# Patient Record
Sex: Male | Born: 1970 | Race: White | Hispanic: No | Smoking: Current every day smoker
Health system: Southern US, Community
[De-identification: ages and names within clinical notes are randomized; demographics above are authoritative.]

## PROBLEM LIST (undated history)

## (undated) DIAGNOSIS — Q6689 Other  specified congenital deformities of feet: Secondary | ICD-10-CM

## (undated) DIAGNOSIS — I10 Essential (primary) hypertension: Secondary | ICD-10-CM

## (undated) DIAGNOSIS — M1990 Unspecified osteoarthritis, unspecified site: Secondary | ICD-10-CM

---

## 2000-03-29 ENCOUNTER — Emergency Department (HOSPITAL_COMMUNITY): Admission: EM | Admit: 2000-03-29 | Discharge: 2000-03-30 | Payer: Self-pay | Admitting: Emergency Medicine

## 2000-07-11 ENCOUNTER — Emergency Department (HOSPITAL_COMMUNITY): Admission: EM | Admit: 2000-07-11 | Discharge: 2000-07-11 | Payer: Self-pay | Admitting: Emergency Medicine

## 2000-07-17 ENCOUNTER — Emergency Department (HOSPITAL_COMMUNITY): Admission: EM | Admit: 2000-07-17 | Discharge: 2000-07-17 | Payer: Self-pay

## 2000-12-07 ENCOUNTER — Emergency Department (HOSPITAL_COMMUNITY): Admission: EM | Admit: 2000-12-07 | Discharge: 2000-12-07 | Payer: Self-pay | Admitting: Emergency Medicine

## 2001-01-02 ENCOUNTER — Encounter: Admission: RE | Admit: 2001-01-02 | Discharge: 2001-01-02 | Payer: Self-pay | Admitting: Family Medicine

## 2001-07-16 ENCOUNTER — Emergency Department (HOSPITAL_COMMUNITY): Admission: EM | Admit: 2001-07-16 | Discharge: 2001-07-16 | Payer: Self-pay

## 2001-07-18 ENCOUNTER — Encounter: Admission: RE | Admit: 2001-07-18 | Discharge: 2001-07-18 | Payer: Self-pay | Admitting: Family Medicine

## 2001-07-24 ENCOUNTER — Inpatient Hospital Stay (HOSPITAL_COMMUNITY): Admission: EM | Admit: 2001-07-24 | Discharge: 2001-08-01 | Payer: Self-pay | Admitting: *Deleted

## 2001-08-06 ENCOUNTER — Emergency Department (HOSPITAL_COMMUNITY): Admission: EM | Admit: 2001-08-06 | Discharge: 2001-08-06 | Payer: Self-pay | Admitting: Emergency Medicine

## 2001-08-19 ENCOUNTER — Emergency Department (HOSPITAL_COMMUNITY): Admission: EM | Admit: 2001-08-19 | Discharge: 2001-08-19 | Payer: Self-pay | Admitting: Emergency Medicine

## 2001-09-17 ENCOUNTER — Emergency Department (HOSPITAL_COMMUNITY): Admission: EM | Admit: 2001-09-17 | Discharge: 2001-09-17 | Payer: Self-pay

## 2001-11-02 ENCOUNTER — Encounter: Admission: RE | Admit: 2001-11-02 | Discharge: 2001-11-02 | Payer: Self-pay | Admitting: Family Medicine

## 2001-12-04 ENCOUNTER — Emergency Department (HOSPITAL_COMMUNITY): Admission: EM | Admit: 2001-12-04 | Discharge: 2001-12-04 | Payer: Self-pay

## 2002-07-23 ENCOUNTER — Emergency Department (HOSPITAL_COMMUNITY): Admission: EM | Admit: 2002-07-23 | Discharge: 2002-07-23 | Payer: Self-pay | Admitting: Emergency Medicine

## 2002-07-23 ENCOUNTER — Encounter: Payer: Self-pay | Admitting: Emergency Medicine

## 2002-07-28 ENCOUNTER — Encounter: Payer: Self-pay | Admitting: Emergency Medicine

## 2002-07-28 ENCOUNTER — Emergency Department (HOSPITAL_COMMUNITY): Admission: EM | Admit: 2002-07-28 | Discharge: 2002-07-28 | Payer: Self-pay | Admitting: Emergency Medicine

## 2002-07-29 ENCOUNTER — Emergency Department (HOSPITAL_COMMUNITY): Admission: EM | Admit: 2002-07-29 | Discharge: 2002-07-29 | Payer: Self-pay | Admitting: *Deleted

## 2002-08-02 ENCOUNTER — Inpatient Hospital Stay (HOSPITAL_COMMUNITY): Admission: EM | Admit: 2002-08-02 | Discharge: 2002-08-03 | Payer: Self-pay | Admitting: Emergency Medicine

## 2002-08-03 ENCOUNTER — Inpatient Hospital Stay (HOSPITAL_COMMUNITY): Admission: EM | Admit: 2002-08-03 | Discharge: 2002-08-07 | Payer: Self-pay | Admitting: Psychiatry

## 2002-08-16 ENCOUNTER — Ambulatory Visit (HOSPITAL_COMMUNITY): Admission: EM | Admit: 2002-08-16 | Discharge: 2002-08-17 | Payer: Self-pay | Admitting: Emergency Medicine

## 2002-08-16 ENCOUNTER — Encounter: Payer: Self-pay | Admitting: Emergency Medicine

## 2002-08-22 ENCOUNTER — Encounter: Payer: Self-pay | Admitting: Emergency Medicine

## 2002-08-22 ENCOUNTER — Emergency Department (HOSPITAL_COMMUNITY): Admission: EM | Admit: 2002-08-22 | Discharge: 2002-08-22 | Payer: Self-pay | Admitting: Emergency Medicine

## 2002-08-22 ENCOUNTER — Encounter: Admission: RE | Admit: 2002-08-22 | Discharge: 2002-08-22 | Payer: Self-pay | Admitting: Family Medicine

## 2002-11-07 ENCOUNTER — Encounter: Payer: Self-pay | Admitting: Emergency Medicine

## 2002-11-07 ENCOUNTER — Emergency Department (HOSPITAL_COMMUNITY): Admission: EM | Admit: 2002-11-07 | Discharge: 2002-11-07 | Payer: Self-pay | Admitting: Emergency Medicine

## 2003-03-20 ENCOUNTER — Inpatient Hospital Stay (HOSPITAL_COMMUNITY): Admission: EM | Admit: 2003-03-20 | Discharge: 2003-03-21 | Payer: Self-pay | Admitting: *Deleted

## 2003-04-30 ENCOUNTER — Encounter: Admission: RE | Admit: 2003-04-30 | Discharge: 2003-04-30 | Payer: Self-pay | Admitting: Family Medicine

## 2003-08-09 ENCOUNTER — Encounter: Admission: RE | Admit: 2003-08-09 | Discharge: 2003-08-09 | Payer: Self-pay | Admitting: Family Medicine

## 2003-09-12 ENCOUNTER — Encounter: Admission: RE | Admit: 2003-09-12 | Discharge: 2003-09-12 | Payer: Self-pay | Admitting: Family Medicine

## 2003-09-23 ENCOUNTER — Inpatient Hospital Stay (HOSPITAL_COMMUNITY): Admission: EM | Admit: 2003-09-23 | Discharge: 2003-09-27 | Payer: Self-pay | Admitting: Emergency Medicine

## 2003-10-08 ENCOUNTER — Emergency Department (HOSPITAL_COMMUNITY): Admission: EM | Admit: 2003-10-08 | Discharge: 2003-10-09 | Payer: Self-pay | Admitting: Emergency Medicine

## 2006-06-23 DIAGNOSIS — L408 Other psoriasis: Secondary | ICD-10-CM | POA: Insufficient documentation

## 2006-06-23 DIAGNOSIS — K219 Gastro-esophageal reflux disease without esophagitis: Secondary | ICD-10-CM

## 2006-06-23 DIAGNOSIS — F17200 Nicotine dependence, unspecified, uncomplicated: Secondary | ICD-10-CM

## 2020-08-19 ENCOUNTER — Emergency Department (HOSPITAL_COMMUNITY)
Admission: EM | Admit: 2020-08-19 | Discharge: 2020-08-19 | Disposition: A | Discharge status: Home / Self Care | Payer: No Typology Code available for payment source | Attending: Emergency Medicine | Admitting: Emergency Medicine

## 2020-08-19 ENCOUNTER — Emergency Department (HOSPITAL_COMMUNITY): Payer: No Typology Code available for payment source

## 2020-08-19 ENCOUNTER — Encounter (HOSPITAL_COMMUNITY): Payer: Self-pay | Admitting: Emergency Medicine

## 2020-08-19 ENCOUNTER — Emergency Department (HOSPITAL_COMMUNITY)
Admission: EM | Admit: 2020-08-19 | Discharge: 2020-08-20 | Disposition: A | Discharge status: Home / Self Care | Payer: No Typology Code available for payment source | Source: Home / Self Care | Attending: Emergency Medicine | Admitting: Emergency Medicine

## 2020-08-19 ENCOUNTER — Other Ambulatory Visit: Payer: Self-pay

## 2020-08-19 DIAGNOSIS — I10 Essential (primary) hypertension: Secondary | ICD-10-CM | POA: Insufficient documentation

## 2020-08-19 DIAGNOSIS — Y990 Civilian activity done for income or pay: Secondary | ICD-10-CM | POA: Insufficient documentation

## 2020-08-19 DIAGNOSIS — X500XXA Overexertion from strenuous movement or load, initial encounter: Secondary | ICD-10-CM | POA: Insufficient documentation

## 2020-08-19 DIAGNOSIS — F4321 Adjustment disorder with depressed mood: Secondary | ICD-10-CM | POA: Insufficient documentation

## 2020-08-19 DIAGNOSIS — Z20822 Contact with and (suspected) exposure to covid-19: Secondary | ICD-10-CM | POA: Insufficient documentation

## 2020-08-19 DIAGNOSIS — Y9289 Other specified places as the place of occurrence of the external cause: Secondary | ICD-10-CM | POA: Diagnosis not present

## 2020-08-19 DIAGNOSIS — R45851 Suicidal ideations: Secondary | ICD-10-CM | POA: Insufficient documentation

## 2020-08-19 DIAGNOSIS — S4991XA Unspecified injury of right shoulder and upper arm, initial encounter: Secondary | ICD-10-CM | POA: Insufficient documentation

## 2020-08-19 DIAGNOSIS — Y93F2 Activity, caregiving, lifting: Secondary | ICD-10-CM | POA: Insufficient documentation

## 2020-08-19 DIAGNOSIS — F1910 Other psychoactive substance abuse, uncomplicated: Secondary | ICD-10-CM

## 2020-08-19 HISTORY — DX: Essential (primary) hypertension: I10

## 2020-08-19 LAB — RESP PANEL BY RT-PCR (FLU A&B, COVID) ARPGX2
Influenza A by PCR: NEGATIVE
Influenza B by PCR: NEGATIVE
SARS Coronavirus 2 by RT PCR: NEGATIVE

## 2020-08-19 LAB — CBC WITH DIFFERENTIAL/PLATELET
Abs Immature Granulocytes: 0.03 10*3/uL (ref 0.00–0.07)
Basophils Absolute: 0 10*3/uL (ref 0.0–0.1)
Basophils Relative: 0 %
Eosinophils Absolute: 0.1 10*3/uL (ref 0.0–0.5)
Eosinophils Relative: 2 %
HCT: 49.4 % (ref 39.0–52.0)
Hemoglobin: 15.9 g/dL (ref 13.0–17.0)
Immature Granulocytes: 0 %
Lymphocytes Relative: 21 %
Lymphs Abs: 1.7 10*3/uL (ref 0.7–4.0)
MCH: 30.1 pg (ref 26.0–34.0)
MCHC: 32.2 g/dL (ref 30.0–36.0)
MCV: 93.6 fL (ref 80.0–100.0)
Monocytes Absolute: 1 10*3/uL (ref 0.1–1.0)
Monocytes Relative: 13 %
Neutro Abs: 5.1 10*3/uL (ref 1.7–7.7)
Neutrophils Relative %: 64 %
Platelets: 197 10*3/uL (ref 150–400)
RBC: 5.28 MIL/uL (ref 4.22–5.81)
RDW: 12.7 % (ref 11.5–15.5)
WBC: 8 10*3/uL (ref 4.0–10.5)
nRBC: 0 % (ref 0.0–0.2)

## 2020-08-19 LAB — COMPREHENSIVE METABOLIC PANEL
ALT: 61 U/L — ABNORMAL HIGH (ref 0–44)
AST: 35 U/L (ref 15–41)
Albumin: 3.9 g/dL (ref 3.5–5.0)
Alkaline Phosphatase: 64 U/L (ref 38–126)
Anion gap: 8 (ref 5–15)
BUN: 13 mg/dL (ref 6–20)
CO2: 27 mmol/L (ref 22–32)
Calcium: 9.6 mg/dL (ref 8.9–10.3)
Chloride: 105 mmol/L (ref 98–111)
Creatinine, Ser: 0.94 mg/dL (ref 0.61–1.24)
GFR, Estimated: >60 mL/min (ref >60)
Glucose, Bld: 95 mg/dL (ref 70–99)
Potassium: 4.3 mmol/L (ref 3.5–5.1)
Sodium: 140 mmol/L (ref 135–145)
Total Bilirubin: 0.8 mg/dL (ref 0.3–1.2)
Total Protein: 6.7 g/dL (ref 6.5–8.1)

## 2020-08-19 LAB — RAPID URINE DRUG SCREEN, HOSP PERFORMED
Amphetamines: NOT DETECTED
Barbiturates: NOT DETECTED
Benzodiazepines: NOT DETECTED
Cocaine: NOT DETECTED
Opiates: NOT DETECTED
Tetrahydrocannabinol: NOT DETECTED

## 2020-08-19 LAB — ETHANOL: Alcohol, Ethyl (B): <10 mg/dL (ref <10)

## 2020-08-19 MED ORDER — ACETAMINOPHEN 500 MG PO TABS
1000.0000 mg | ORAL_TABLET | Freq: Once | ORAL | Status: AC
  Administered 2020-08-19: 1000 mg via ORAL
  Filled 2020-08-19: qty 2

## 2020-08-19 NOTE — ED Provider Notes (Signed)
[facility] EMERGENCY DEPARTMENT Provider Note   CSN: [id number] Arrival date & time: 08/19/20  1700     History Chief Complaint  Patient presents with  . Suicidal  . Depression    Tristan Valdez is a 50 y.o. male.  Patient with history of drug use presents the emergency department for suicidal ideation.  Patient states that he been having worsening thoughts of not wanting to be here.  Denies concrete plan.  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  Denies any recent medical problems other than bilateral foot burning which she first noticed when walking from triage tonight.  Denies history of diabetes.  No fevers, nausea, vomiting, diarrhea.  Onset of symptoms gradual.  Course worsening.        Past Medical History:  Diagnosis Date  . Hypertension     Patient Active Problem List   Diagnosis Date Noted  . TOBACCO DEPENDENCE 06/23/2006  . GASTROESOPHAGEAL REFLUX, NO ESOPHAGITIS 06/23/2006  . PSORIASIS 06/23/2006    History reviewed. No pertinent surgical history.     History reviewed. No pertinent family history.     Home Medications Prior to Admission medications   Not on File    Allergies    Patient has no known allergies.  Review of Systems   Review of Systems  Constitutional: Negative for fever.  HENT: Negative for rhinorrhea and sore throat.   Eyes: Negative for redness.  Respiratory: Negative for cough.   Cardiovascular: Negative for chest pain.  Gastrointestinal: Negative for abdominal pain, diarrhea, nausea and vomiting.  Genitourinary: Negative for dysuria and hematuria.  Musculoskeletal: Negative for myalgias.  Skin: Negative for rash.  Neurological: Positive for numbness (feet burning). Negative for headaches.  Psychiatric/Behavioral: Positive for suicidal ideas.    Physical Exam Updated Vital Signs BP 132/88 (BP Location: Left Arm)   Pulse 78   Temp 98.3 F (36.8 C)  (Oral)   Resp 20   SpO2 100%   Physical Exam Vitals and nursing note reviewed.  Constitutional:      Appearance: He is well-developed.  HENT:     Head: Normocephalic and atraumatic.  Eyes:     Conjunctiva/sclera: Conjunctivae normal.  Pulmonary:     Effort: No respiratory distress.  Musculoskeletal:     Cervical back: Normal range of motion and neck supple.  Skin:    General: Skin is warm and dry.  Neurological:     Mental Status: He is alert.     GCS: GCS eye subscore is 4. GCS verbal subscore is 5. GCS motor subscore is 6.  Psychiatric:        Mood and Affect: Mood is depressed.        Thought Content: Thought content includes suicidal ideation. Thought content does not include homicidal ideation. Thought content does not include homicidal or suicidal plan.     ED Results / Procedures / Treatments   Labs (all labs ordered are listed, but only abnormal results are displayed) Labs Reviewed  COMPREHENSIVE METABOLIC PANEL - Abnormal; Notable for the following components:      Result Value   ALT 61 (*)    All other components within normal limits  RESP PANEL BY RT-PCR (FLU A&B, COVID) ARPGX2  CBC WITH DIFFERENTIAL/PLATELET  ETHANOL  RAPID URINE DRUG SCREEN, HOSP PERFORMED    EKG None  Radiology DG Shoulder Right  Result Date: 08/19/2020 CLINICAL DATA:  Pop in the shoulder with movement. EXAM:  RIGHT SHOULDER - 2+ VIEW COMPARISON:  None. FINDINGS: There is no evidence of fracture or dislocation. There is no evidence of arthropathy or other focal bone abnormality. Soft tissues are unremarkable. IMPRESSION: Negative. Electronically Signed   By: Burman Nieves M.D.   On: 08/19/2020 03:01    Procedures Procedures   Medications Ordered in ED Medications - No data to display  ED Course  I have reviewed the triage vital signs and the nursing notes.  Pertinent labs & imaging results that were available during my care of the patient were reviewed by me and considered in  my medical decision making (see chart for details).  Patient seen and examined. Work-up initiated. TTS consult ordered. Seen here last night for shoulder pain.   Vital signs reviewed and are as follows: BP 132/88 (BP Location: Left Arm)   Pulse 78   Temp 98.3 F (36.8 C) (Oral)   Resp 20   SpO2 100%   11:34 PM Signout to [employer] at shift change. Pending TTS consult. Pt is voluntary, medically cleared.    MDM Rules/Calculators/A&P                          Pending TTS.   Final Clinical Impression(s) / ED Diagnoses Final diagnoses:  Suicidal ideation    Rx / DC Orders ED Discharge Orders    None       Renne Crigler, Cordelia Poche 08/19/20 2336    Terald Sleeper, MD 08/20/20 1043

## 2020-08-19 NOTE — ED Provider Notes (Signed)
[facility] EMERGENCY DEPARTMENT Provider Note   CSN: [id number] Arrival date & time: 08/19/20  0202     History Chief Complaint  Patient presents with  . Shoulder Pain    Tristan Valdez is a 50 y.o. male presents to the ED for evaluation of sudden onset right posterior shoulder pain when he was lifting a weed eater above his head.  Pain is constant, throbbing and worse when he lifts his arm above his head.  No previous injury, surgeries.  No associated neck pain, arm tingling, numbness or weakness.  No associated chest pain.  No associated swelling.  No interventions.  HPI     Past Medical History:  Diagnosis Date  . Hypertension     Patient Active Problem List   Diagnosis Date Noted  . TOBACCO DEPENDENCE 06/23/2006  . GASTROESOPHAGEAL REFLUX, NO ESOPHAGITIS 06/23/2006  . PSORIASIS 06/23/2006    History reviewed. No pertinent surgical history.     No family history on file.     Home Medications Prior to Admission medications   Not on File    Allergies    Patient has no known allergies.  Review of Systems   Review of Systems  Musculoskeletal: Positive for arthralgias.  All other systems reviewed and are negative.   Physical Exam Updated Vital Signs BP (!) 159/86 (BP Location: Right Arm)   Pulse 88   Temp 98.2 F (36.8 C) (Oral)   Resp 16   SpO2 98%   Physical Exam Vitals and nursing note reviewed.  Constitutional:      General: He is not in acute distress.    Appearance: He is well-developed.     Comments: NAD.  HENT:     Head: Normocephalic and atraumatic.     Right Ear: External ear normal.     Left Ear: External ear normal.     Nose: Nose normal.  Eyes:     General: No scleral icterus.    Conjunctiva/sclera: Conjunctivae normal.  Cardiovascular:     Rate and Rhythm: Normal rate and regular rhythm.     Pulses:          Radial pulses are 1+ on the right side.     Heart sounds: Normal heart sounds. No murmur  heard.   Pulmonary:     Effort: Pulmonary effort is normal.     Breath sounds: Normal breath sounds. No wheezing.  Musculoskeletal:        General: No deformity. Normal range of motion.     Right upper arm: Tenderness present.       Arms:     Cervical back: Normal range of motion and neck supple.     Comments:  c-spine: no midline/paraspinal tenderness. Full ROM without pain Right upper extremity: mild TTP posterior shoulder and upper scapula. Normal skin. No focal bony tenderness over T-spine, clavicle, right ribs.  Pain elicit with shoulder abduction, flexion, cross over. Negative drop arm test. +Faber's and Hawkins. No focal tenderness of elbow. Elbow with full ROM. No extremity edema, warmth.   Skin:    General: Skin is warm and dry.     Capillary Refill: Capillary refill takes less than 2 seconds.  Neurological:     Mental Status: He is alert and oriented to person, place, and time.     Comments: Sensation and strength intact in RUE   Psychiatric:        Behavior: Behavior normal.        Thought Content: Thought content  normal.        Judgment: Judgment normal.     ED Results / Procedures / Treatments   Labs (all labs ordered are listed, but only abnormal results are displayed) Labs Reviewed - No data to display  EKG None  Radiology DG Shoulder Right  Result Date: 08/19/2020 CLINICAL DATA:  Pop in the shoulder with movement. EXAM: RIGHT SHOULDER - 2+ VIEW COMPARISON:  None. FINDINGS: There is no evidence of fracture or dislocation. There is no evidence of arthropathy or other focal bone abnormality. Soft tissues are unremarkable. IMPRESSION: Negative. Electronically Signed   By: Burman Nieves M.D.   On: 08/19/2020 03:01    Procedures Procedures   Medications Ordered in ED Medications  acetaminophen (TYLENOL) tablet 1,000 mg (1,000 mg Oral Given 08/19/20 0516)    ED Course  I have reviewed the triage vital signs and the nursing notes.  Pertinent labs &  imaging results that were available during my care of the patient were reviewed by me and considered in my medical decision making (see chart for details).    MDM Rules/Calculators/A&P                          50 year old male presents to the ED for sudden onset posterior shoulder pain when he was lifting a weed eater above his head.  Exam reveals reproducible tenderness on the posterior deltoid, upper scapula.  Normal skin.  No distal neuro or pulse deficits.  No associated neck pain or radicular symptoms.  No signs of infection.  Doubt DVT.  Suspect soft tissue injury.  X-ray ordered in triage, this was personally reviewed and interpreted and does not show any acute bony abnormalities, dislocations.  Tylenol given.  Will place in a shoulder immobilizer for 48 hours.  Recommended early range of motion exercises, ice and Ortho follow-up.  Discussed her return precautions, patient is in agreeable with plan of care and disposition  Final Clinical Impression(s) / ED Diagnoses Final diagnoses:  Soft tissue injury of right shoulder, initial encounter    Rx / DC Orders ED Discharge Orders    None       Jerrell Mylar 08/19/20 1610    Zadie Rhine, MD 08/19/20 2322

## 2020-08-19 NOTE — ED Triage Notes (Signed)
Pt presents to ED POV. Pt c/o SI and depression w/o a plan. 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

## 2020-08-19 NOTE — Progress Notes (Signed)
Orthopedic Tech Progress Note Patient Details:  Tristan Valdez March 26, 1971 [id number]  Ortho Devices Type of Ortho Device: Sling immobilizer Ortho Device/Splint Location: rue Ortho Device/Splint Interventions: Ordered,Application,Adjustment   Post Interventions Patient Tolerated: Well Instructions Provided: Care of device,Adjustment of device   [name] Post 08/19/2020, 5:53 AM

## 2020-08-19 NOTE — Discharge Instructions (Addendum)
  You were seen in the emergency department for right shoulder pain  X-rays are normal  I suspect you have a soft tissue overuse injury  Wear your sling for 48 hours.  After 48 hours start doing light range of motion exercises with your shoulder as tolerated.  Ice for 20 minutes at a time at least 3 times daily.  For pain and inflammation you can use a combination of ibuprofen and acetaminophen.  Take [phone] mg acetaminophen (tylenol) every 6 hours or 600 mg ibuprofen (advil, motrin) every 6 hours.  You can take these separately or combine them every 6 hours for maximum pain control. Do not exceed 4,000 mg acetaminophen or 2,400 mg ibuprofen in a 24 hour period.  Do not take ibuprofen containing products if you have history of kidney disease, ulcers, GI bleeding, severe acid reflux, or take a blood thinner.  Do not take acetaminophen if you have liver disease.   Pain and range of motion should improve over the course of 7 to 10 days.  If pain and range of motion have not improved please follow-up with orthopedic doctor below.  Return to the ED for worsening or severe pain, arm swelling, redness, warmth, fevers, chest pain, tingling or numbness or weakness

## 2020-08-19 NOTE — ED Triage Notes (Signed)
Emergency Medicine Provider Triage Evaluation Note  Tristan Valdez , a 50 y.o. male  was evaluated in triage.  Pt complains of suicidal ideation.  Review of Systems  Positive: SI, depression Negative: HI, hallucination  Physical Exam  BP 132/88 (BP Location: Left Arm)   Pulse 78   Temp 98.3 F (36.8 C) (Oral)   Resp 20   SpO2 100%  Gen:   Awake, no distress   HEENT:  Atraumatic  Resp:  Normal effort  Cardiac:  Normal rate  Abd:   Nondistended, nontender  MSK:   Moves extremities without difficulty  Neuro:  Speech clear   Medical Decision Making  Medically screening exam initiated at 6:20 PM.  Appropriate orders placed.  Tristan Valdez was informed that the remainder of the evaluation will be completed by another provider, this initial triage assessment does not replace that evaluation, and the importance of remaining in the ED until their evaluation is complete.  Clinical Impression  Report SI for several days without specific plan.  No HI or hallucination.  No recent drug or alcohol use.     Tristan Helper, PA-C 08/19/20 1821

## 2020-08-19 NOTE — ED Triage Notes (Signed)
Pt c/o right shoulder pain, states he was at work lifting something and felt a "pop".

## 2020-08-19 NOTE — ED Notes (Addendum)
Shoulder immobilizer applied, pt verbalized understanding and demonstrated understanding for wearing and adjusting sling.

## 2020-08-19 NOTE — ED Triage Notes (Signed)
Emergency Medicine Provider Triage Evaluation Note  Tristan Valdez , a 50 y.o. male  was evaluated in triage.  Pt complains of right posterior shoulder pain when he was lifting something above head, heard pop.  Review of Systems  Positive: Shoulder pain Negative: Neck pain, arm tingling, numbness, chest pain   Physical Exam  BP (!) 159/86 (BP Location: Right Arm)   Pulse 88   Temp 98.2 F (36.8 C) (Oral)   Resp 16   SpO2 98%  Gen:   Awake, no distress   HEENT:  Atraumatic  Resp:  Normal effort  Cardiac:  Normal rate  Abd:   Nondistended MSK:   Posterior shoulder, scapular TTP Neuro:  Sensation and strength intact in RUE Skin:   Normal skin over right shoulder/scapula   Medical Decision Making  Medically screening exam initiated at 2:36 AM.  Appropriate orders placed.  Tristan Valdez was informed that the remainder of the evaluation will be completed by another provider, this initial triage assessment does not replace that evaluation, and the importance of remaining in the ED until their evaluation is complete.  Clinical Impression   X-ray, tylenol ordered.   Patient was informed that the remainder of the evaluation will be completed by another provider.  Patient made aware this encounter is a triage and screening encounter and no beds are available at this time in the ER.  Patient made aware triage orders have been placed and patient will be placed in the waiting room while work up is initiated. Patient encouraged to await a formal ER encounter with a clinician once an ER bed becomes available.  Patient made aware that exiting the department prior to formal encounter with an ER clinician and completion of the work-up is considered leaving against medical advice and there is no guarantee that there are no emergency medical conditions present and patient assumes risks of leaving without completion of ER evaluation including worsening condition, permanent disability and death. Patient  verbalizes understanding. Patient has signed triage/MSE paperwork.    Liberty Handy, PA-C 08/19/20 [phone]

## 2020-08-20 NOTE — Social Work (Signed)
CSW met with Pt at bedside. Pt reports that he left [facility] in [facility] voluntarily, wishes for an alternative inpatient rehab program but requests that it be free, has no work requirement and prefers that it nor be [place] based.  Pt cannot be referred to [facility] due to lack of ID. Pt states he has been to [facility] and they sent him for replacement ID but he has not gone. Pt reports that he has friends in [place] area. CSW gave printed resource list for SUD as well as a bus pass for transportation.

## 2020-08-20 NOTE — ED Provider Notes (Signed)
Pt has been cleared psychiatrically - stable for d/c - states he wants to go to friends in [place] [employer] given, not suicidal   Eber Hong, MD 08/20/20 1840

## 2020-08-20 NOTE — ED Provider Notes (Signed)
50 yo male presented 20 hours ago with SI.  Patient seen and medically cleared. Now awaiting TTS     Margarita Grizzle, MD 08/20/20 1229

## 2020-08-20 NOTE — Discharge Instructions (Signed)
Substance Abuse Treatment Programs ° °Intensive Outpatient Programs °[facility]     °[address]      °[place], [place]                   °[phone]      ° °[facility] °[address] °[place], [place] °[phone] ° °[facility]     °(Inpatient and outpatient)     °[address]           °[phone]   ° °[facility] °336-288-1484 (Suboxone and Methadone) ° °[address]      °[place], [place] [address]      °336-882-2125      ° °[address] °[place], [place] °852-3033 ° °[facility] (Outpatient/Inpatient, Chemical)    °(insurance only) [phone]      °       °[facility] (Groups & Residential) °[place], [place] °[phone] ° °   °[facility]     °[address]     °[place], [place]      °[phone]      ° °[facility] (for caregivers and family) °[address] °[place], [place] °336-299-4655 ° ° ° ° ° °Residential Treatment Programs °[facility]      °[address], [place], [place] [address]  °[phone]      ° °[facility] °[address] [place], [place] [address] °[phone] ° °[facility]        °336-248-8914      ° °[facility] °1-800-659-3381 ° °[facility]             °[address]                                         °[place], [place]                                                °877-615-2722 or 336-784-9470                              ° °[facility] [place] °[address] °[place], [address] °[phone] ° °[facility]    °[address]      °[place], [place]     °[phone]      ° °[facility] °[address] °[place], [place] °336-285-9073 ° °[facility]   °[address]     °[place], [place] [address]     °[phone]      °Admissions: 8am-3pm M-F ° °[facility] °[address] °[place],  [place] °336-227-7417 ° °[facility]: Residential Program (90 Days)   °[place], [place]      °336-725-8389 or 800-758-6077    ° °[facility] °[place], [place] °(Walk in Hours over the weekend or by referral) ° °[facility] °[address], [place],  [address] °([phone] ° °Crisis Mobile: [facility]:  1-877-626-1772 (for crisis response 24 hours a day) °[facility] Hotline:      1-800-256-2452 °Outpatient Psychiatry and Counseling ° °[facility]: Mobile Crisis   Management 24 hours:  1-877-626-1772 ° °[facility] sliding scale fee and walk in schedule: M-F 8am-12pm/1pm-3pm °[address]  °[place], [place] [address] °[phone] ° °[facility] °[address] °[place], [place] [address] °336-703-9650 ° °[facility] ([facility] known [facility])- new patient walk-in appointments available Monday - Friday 8am -3pm.          °[address] °[place], [place] [address] °336-676-6840 or crisis line- 336-676-6905 ° °[facility]/ Intensive Outpatient Therapy Program °[address] °[place], [place] [address] °336-832-9804 ° °[facility]                  °Crisis Services      °[phone]      °[address]     °[place], [place] [address]                ° °[facility]   °[facility] °[phone] °[address] °[place], [place] [address] ° ° °[facility]          °[address]  °[place], [place] [address]       °[phone] ° °[facility] °[address] °[place], [place] [address] °336-292-1510 ° °[facility]    °[address]    °[place], [place] [address]     °336-632-3505      ° °Parish McKinney, MD     °[address]     °[place] [address]     °336-282-1251     °  °[facility] °[address] °[place] [address] ° °[facility]     °[address]     °[place], [place]      °336-542-2076      ° °[facility] °Shamsher Ahluwalia, MD °[address] °[place], [place] [address] °336-420-9558 ° °[facility]     °[address]     °[place], [place] [address]     °336-274-1237      ° °[facility] °[address] °[place], [place] [address] °336-854-4450 °Resources for Temporary Residential Assistance/Crisis Centers ° °DAY CENTERS °[facility] °M-F 8am-3pm   °[address] [place], [place] [address]   336-332-0824 °Services include: laundry, barbering, support groups, case management, phone  & computer access, showers, AA/NA mtgs, mental health/substance abuse nurse, job skills class, disability information, VA assistance, spiritual classes, etc.  ° °HOMELESS SHELTERS ° °[facility]     °[facility]   °[address], [place]     °[phone]       °       °[facility] (women and children)       °[address] °[place], [place] [address] °336-275-0820 °[email or web address] for application and process °Application Required ° °[facility]   °[address]    °[place] [address]     °[phone]       °             °[facility] °[address] °,  [address] °336.273.5572 °336-235-0363(schedule application appt.) °Application Required ° °[facility] (women only)    °[address]     °[place],  [address]     °336-884-1039      °  Intake starts 6pm daily °Need valid ID, SSC, & Police report °[facility] [place] °[address] °[place], [place] °336-881-5420 °Application Required ° °[facility] (men only)     °[address]      °[place], [place]     °[phone]      ° °[facility] °(Pregnant women only) °[address] °[place], [place] °336-275-0206 ° °[facility]      °[address]      °[place], [place] [address]     °336-722-9951      °       °[facility] °[address] °[place], [place] °336-723-1848 °90 day commitment/SA/Application process ° °[facility](men only)     °[address]     °[place], [place]     °[phone]       °Check-in at 7pm     °       °[facility] °[address] °[place], [place] [address] °336-248-6684 °Men/Women/Women and Children must be there by 7 pm ° °[facility] °[place],  °336-722-8721                ° °

## 2020-08-20 NOTE — ED Provider Notes (Signed)
  Physical Exam  BP 132/88 (BP Location: Left Arm)   Pulse 78   Temp 98.3 F (36.8 C) (Oral)   Resp 20   SpO2 100%   MDM   0056: care assumed from previous EDPA.  See previous note for details. Endorsing worsening thoughts of not wanting to be here but no specific plan. H/o drug use. Awaiting TTS evaluation which was ordered at 1890.       Liberty Handy, PA-C 08/20/20 0058    Sabas Sous, MD 08/20/20 [phone]
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
## 2020-10-06 ENCOUNTER — Other Ambulatory Visit: Payer: Self-pay

## 2020-10-06 ENCOUNTER — Emergency Department (HOSPITAL_COMMUNITY): Payer: No Typology Code available for payment source

## 2020-10-06 ENCOUNTER — Emergency Department (HOSPITAL_COMMUNITY)
Admission: EM | Admit: 2020-10-06 | Discharge: 2020-10-07 | Disposition: A | Discharge status: Other Acute Inpatient Hospital | Payer: No Typology Code available for payment source | Attending: Emergency Medicine | Admitting: Emergency Medicine

## 2020-10-06 ENCOUNTER — Encounter (HOSPITAL_COMMUNITY): Payer: Self-pay | Admitting: *Deleted

## 2020-10-06 DIAGNOSIS — R440 Auditory hallucinations: Secondary | ICD-10-CM | POA: Diagnosis not present

## 2020-10-06 DIAGNOSIS — I10 Essential (primary) hypertension: Secondary | ICD-10-CM | POA: Insufficient documentation

## 2020-10-06 DIAGNOSIS — F1910 Other psychoactive substance abuse, uncomplicated: Secondary | ICD-10-CM | POA: Insufficient documentation

## 2020-10-06 DIAGNOSIS — F32A Depression, unspecified: Secondary | ICD-10-CM

## 2020-10-06 DIAGNOSIS — F1120 Opioid dependence, uncomplicated: Secondary | ICD-10-CM | POA: Diagnosis not present

## 2020-10-06 DIAGNOSIS — F15259 Other stimulant dependence with stimulant-induced psychotic disorder, unspecified: Secondary | ICD-10-CM | POA: Diagnosis not present

## 2020-10-06 DIAGNOSIS — E876 Hypokalemia: Secondary | ICD-10-CM | POA: Diagnosis not present

## 2020-10-06 DIAGNOSIS — Y900 Blood alcohol level of less than 20 mg/100 ml: Secondary | ICD-10-CM | POA: Insufficient documentation

## 2020-10-06 DIAGNOSIS — R45851 Suicidal ideations: Secondary | ICD-10-CM

## 2020-10-06 DIAGNOSIS — F1994 Other psychoactive substance use, unspecified with psychoactive substance-induced mood disorder: Secondary | ICD-10-CM | POA: Diagnosis present

## 2020-10-06 DIAGNOSIS — R443 Hallucinations, unspecified: Secondary | ICD-10-CM

## 2020-10-06 DIAGNOSIS — F17210 Nicotine dependence, cigarettes, uncomplicated: Secondary | ICD-10-CM | POA: Diagnosis not present

## 2020-10-06 DIAGNOSIS — Z20822 Contact with and (suspected) exposure to covid-19: Secondary | ICD-10-CM | POA: Diagnosis not present

## 2020-10-06 DIAGNOSIS — F314 Bipolar disorder, current episode depressed, severe, without psychotic features: Secondary | ICD-10-CM | POA: Diagnosis not present

## 2020-10-06 DIAGNOSIS — F419 Anxiety disorder, unspecified: Secondary | ICD-10-CM | POA: Insufficient documentation
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
## 2020-10-06 NOTE — ED Provider Notes (Signed)
Emergency Medicine Provider Triage Evaluation Note  Tristan Valdez , a 50 y.o. male  was evaluated in triage.  Pt complains of hallucinations. Patient reports increased anxiety, currently living on the streets, has started to hallucinate- this has never happened to him before. Has had thoughts of self harm previously, none at present, admits to wanting to harm others. Last used meth 1 week ago. Denies alcohol use.   Review of Systems  Positive: Hallucinations, SI (not at present), HI Negative: Chest pain, abdominal pain.   Physical Exam  BP 133/82 (BP Location: Right Arm)   Pulse (!) 57   Temp 98.6 F (37 C) (Oral)   Resp 15   SpO2 98%  Gen:   Awake, no distress   Resp:  Normal effort  MSK:   Moves extremities without difficulty  Other:  Cooperative.   Medical Decision Making  Medically screening exam initiated at 11:45 PM.  Appropriate orders placed.  [name] was informed that the remainder of the evaluation will be completed by another provider, this initial triage assessment does not replace that evaluation, and the importance of remaining in the ED until their evaluation is complete.  New hallucinations, no drug use at time of onset, no known history of psychosis, precautionary head CT to r/o acute intracranial pathology, screening labs, consult placed to TTS.   Hallucinations.    Desmond Lope 10/06/20 2355    Mesner, Barbara Cower, MD 10/07/20 [phone]

## 2020-10-07 ENCOUNTER — Other Ambulatory Visit: Payer: Self-pay | Admitting: Registered Nurse

## 2020-10-07 ENCOUNTER — Inpatient Hospital Stay (HOSPITAL_COMMUNITY)
Admission: AD | Admit: 2020-10-07 | Discharge: 2020-10-14 | DRG: 885 | Disposition: A | Discharge status: Home / Self Care | Payer: No Typology Code available for payment source | Source: Intra-hospital | Attending: Psychiatry | Admitting: Psychiatry

## 2020-10-07 ENCOUNTER — Encounter (HOSPITAL_COMMUNITY): Payer: Self-pay | Admitting: Registered Nurse

## 2020-10-07 DIAGNOSIS — G4700 Insomnia, unspecified: Secondary | ICD-10-CM | POA: Diagnosis present

## 2020-10-07 DIAGNOSIS — F1994 Other psychoactive substance use, unspecified with psychoactive substance-induced mood disorder: Secondary | ICD-10-CM | POA: Diagnosis present

## 2020-10-07 DIAGNOSIS — I10 Essential (primary) hypertension: Secondary | ICD-10-CM | POA: Diagnosis present

## 2020-10-07 DIAGNOSIS — R45851 Suicidal ideations: Secondary | ICD-10-CM | POA: Diagnosis present

## 2020-10-07 DIAGNOSIS — R110 Nausea: Secondary | ICD-10-CM | POA: Diagnosis not present

## 2020-10-07 DIAGNOSIS — F332 Major depressive disorder, recurrent severe without psychotic features: Secondary | ICD-10-CM | POA: Diagnosis present

## 2020-10-07 DIAGNOSIS — M1990 Unspecified osteoarthritis, unspecified site: Secondary | ICD-10-CM | POA: Diagnosis present

## 2020-10-07 DIAGNOSIS — F1514 Other stimulant abuse with stimulant-induced mood disorder: Secondary | ICD-10-CM | POA: Diagnosis present

## 2020-10-07 DIAGNOSIS — K5900 Constipation, unspecified: Secondary | ICD-10-CM | POA: Diagnosis present

## 2020-10-07 DIAGNOSIS — F1510 Other stimulant abuse, uncomplicated: Secondary | ICD-10-CM

## 2020-10-07 DIAGNOSIS — F1910 Other psychoactive substance abuse, uncomplicated: Secondary | ICD-10-CM | POA: Insufficient documentation

## 2020-10-07 DIAGNOSIS — F419 Anxiety disorder, unspecified: Secondary | ICD-10-CM | POA: Diagnosis present

## 2020-10-07 DIAGNOSIS — F314 Bipolar disorder, current episode depressed, severe, without psychotic features: Secondary | ICD-10-CM | POA: Diagnosis not present

## 2020-10-07 DIAGNOSIS — F17210 Nicotine dependence, cigarettes, uncomplicated: Secondary | ICD-10-CM | POA: Diagnosis present

## 2020-10-07 DIAGNOSIS — M62838 Other muscle spasm: Secondary | ICD-10-CM | POA: Diagnosis present

## 2020-10-07 DIAGNOSIS — F333 Major depressive disorder, recurrent, severe with psychotic symptoms: Principal | ICD-10-CM | POA: Diagnosis present

## 2020-10-07 HISTORY — DX: Unspecified osteoarthritis, unspecified site: M19.90

## 2020-10-07 HISTORY — DX: Other specified congenital deformities of feet: Q66.89

## 2020-10-07 LAB — COMPREHENSIVE METABOLIC PANEL
ALT: 25 U/L (ref 0–44)
AST: 19 U/L (ref 15–41)
Albumin: 3.4 g/dL — ABNORMAL LOW (ref 3.5–5.0)
Alkaline Phosphatase: 51 U/L (ref 38–126)
Anion gap: 6 (ref 5–15)
BUN: 18 mg/dL (ref 6–20)
CO2: 28 mmol/L (ref 22–32)
Calcium: 8.9 mg/dL (ref 8.9–10.3)
Chloride: 105 mmol/L (ref 98–111)
Creatinine, Ser: 1 mg/dL (ref 0.61–1.24)
GFR, Estimated: >60 mL/min (ref >60)
Glucose, Bld: 114 mg/dL — ABNORMAL HIGH (ref 70–99)
Potassium: 3.3 mmol/L — ABNORMAL LOW (ref 3.5–5.1)
Sodium: 139 mmol/L (ref 135–145)
Total Bilirubin: 0.2 mg/dL — ABNORMAL LOW (ref 0.3–1.2)
Total Protein: 6 g/dL — ABNORMAL LOW (ref 6.5–8.1)

## 2020-10-07 LAB — CBC
HCT: 42.3 % (ref 39.0–52.0)
Hemoglobin: 13.6 g/dL (ref 13.0–17.0)
MCH: 30.4 pg (ref 26.0–34.0)
MCHC: 32.2 g/dL (ref 30.0–36.0)
MCV: 94.6 fL (ref 80.0–100.0)
Platelets: 169 10*3/uL (ref 150–400)
RBC: 4.47 MIL/uL (ref 4.22–5.81)
RDW: 12.7 % (ref 11.5–15.5)
WBC: 6.7 10*3/uL (ref 4.0–10.5)
nRBC: 0 % (ref 0.0–0.2)

## 2020-10-07 LAB — RAPID URINE DRUG SCREEN, HOSP PERFORMED
Amphetamines: POSITIVE — AB
Barbiturates: NOT DETECTED
Benzodiazepines: NOT DETECTED
Cocaine: NOT DETECTED
Opiates: NOT DETECTED
Tetrahydrocannabinol: NOT DETECTED

## 2020-10-07 LAB — RESP PANEL BY RT-PCR (FLU A&B, COVID) ARPGX2
Influenza A by PCR: NEGATIVE
Influenza B by PCR: NEGATIVE
SARS Coronavirus 2 by RT PCR: NEGATIVE

## 2020-10-07 LAB — ETHANOL: Alcohol, Ethyl (B): <10 mg/dL (ref <10)

## 2020-10-07 LAB — SALICYLATE LEVEL: Salicylate Lvl: <7 mg/dL — ABNORMAL LOW (ref 7.0–30.0)

## 2020-10-07 LAB — ACETAMINOPHEN LEVEL: Acetaminophen (Tylenol), Serum: <10 ug/mL — ABNORMAL LOW (ref 10–30)

## 2020-10-07 MED ORDER — DICYCLOMINE HCL 20 MG PO TABS
20.0000 mg | ORAL_TABLET | Freq: Four times a day (QID) | ORAL | Status: AC | PRN
  Administered 2020-10-09: 20 mg via ORAL
  Filled 2020-10-07: qty 1

## 2020-10-07 MED ORDER — METHOCARBAMOL 500 MG PO TABS
500.0000 mg | ORAL_TABLET | Freq: Three times a day (TID) | ORAL | Status: AC | PRN
  Administered 2020-10-09: 500 mg via ORAL
  Filled 2020-10-07: qty 1

## 2020-10-07 MED ORDER — CLONIDINE HCL 0.1 MG PO TABS: 0.1000 mg | ORAL_TABLET | Freq: Three times a day (TID) | ORAL | Status: DC | PRN

## 2020-10-07 MED ORDER — ONDANSETRON 4 MG PO TBDP
4.0000 mg | ORAL_TABLET | Freq: Four times a day (QID) | ORAL | Status: AC | PRN
  Administered 2020-10-09: 4 mg via ORAL
  Filled 2020-10-07: qty 1

## 2020-10-07 MED ORDER — MAGNESIUM HYDROXIDE 400 MG/5ML PO SUSP: 30.0000 mL | Freq: Every day | ORAL | Status: DC | PRN

## 2020-10-07 MED ORDER — TRAZODONE HCL 50 MG PO TABS
50.0000 mg | ORAL_TABLET | Freq: Every evening | ORAL | Status: DC | PRN
  Administered 2020-10-07 – 2020-10-13 (×4): 50 mg via ORAL
  Filled 2020-10-07: qty 1
  Filled 2020-10-07: qty 7
  Filled 2020-10-07 (×4): qty 1

## 2020-10-07 MED ORDER — POTASSIUM CHLORIDE CRYS ER 20 MEQ PO TBCR
40.0000 meq | EXTENDED_RELEASE_TABLET | Freq: Once | ORAL | Status: AC
  Administered 2020-10-07: 40 meq via ORAL
  Filled 2020-10-07: qty 2

## 2020-10-07 MED ORDER — NAPROXEN 500 MG PO TABS
500.0000 mg | ORAL_TABLET | Freq: Two times a day (BID) | ORAL | Status: AC | PRN
  Administered 2020-10-09: 500 mg via ORAL
  Filled 2020-10-07: qty 1

## 2020-10-07 MED ORDER — HYDROXYZINE HCL 25 MG PO TABS
25.0000 mg | ORAL_TABLET | Freq: Four times a day (QID) | ORAL | Status: AC | PRN
  Administered 2020-10-07 – 2020-10-11 (×4): 25 mg via ORAL
  Filled 2020-10-07 (×5): qty 1

## 2020-10-07 MED ORDER — ACETAMINOPHEN 325 MG PO TABS
650.0000 mg | ORAL_TABLET | Freq: Four times a day (QID) | ORAL | Status: DC | PRN
  Administered 2020-10-07 – 2020-10-09 (×2): 650 mg via ORAL
  Filled 2020-10-07 (×2): qty 2

## 2020-10-07 MED ORDER — ALUM & MAG HYDROXIDE-SIMETH 200-200-20 MG/5ML PO SUSP: 30.0000 mL | ORAL | Status: DC | PRN

## 2020-10-07 MED ORDER — NICOTINE 21 MG/24HR TD PT24
21.0000 mg | MEDICATED_PATCH | Freq: Every day | TRANSDERMAL | Status: DC
  Administered 2020-10-07: 21 mg via TRANSDERMAL
  Filled 2020-10-07: qty 1

## 2020-10-07 MED ORDER — LOPERAMIDE HCL 2 MG PO CAPS: 2.0000 mg | ORAL_CAPSULE | ORAL | Status: AC | PRN

## 2020-10-07 NOTE — Progress Notes (Signed)
Pt accepted to [id number]   Patient meets inpatient criteria per Cecilio Asper, NP.   Dr. Landry Mellow is the attending provider.    Call report to [phone]    Jannifer Franklin, RN @ [facility] ED notified.     Pt scheduled  to arrive at [facility] after 12 Noon.    Damita Dunnings, MSW, LCSW-A  11:12 AM 10/07/2020
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
## 2020-10-07 NOTE — ED Notes (Signed)
Attempt report to [facility] x 1, nurse to call this RN back

## 2020-10-07 NOTE — ED Notes (Signed)
Attempted report x 2 

## 2020-10-07 NOTE — ED Notes (Addendum)
Patient left with safe transport.

## 2020-10-07 NOTE — Progress Notes (Signed)
Patient information has been sent to [facility] via secure chat to review for potential admission. Patient meets inpatient criteria per Cecilio Asper, NP.   Situation ongoing, CSW will continue to monitor progress.    Signed:  Damita Dunnings, MSW, LCSW-A  10/07/2020 10:08 AM

## 2020-10-07 NOTE — Consult Note (Signed)
Telepsych Consultation   Reason for Consult:  Suicidal ideation Referring Physician:  Desmond Lope Location of Patient: [facility] ED Location of Provider: Other: [facility]  Patient Identification: Tristan Valdez MRN:  [id number] Principal Diagnosis: Substance induced mood disorder (HCC) Diagnosis:  Principal Problem:   Substance induced mood disorder (HCC) Active Problems:   Suicidal ideations   Total Time spent with patient: 30 minutes  Subjective:   Tristan Valdez is Valdez 50 y.o. male patient admitted to [facility] ED with complaints of substance use and suicidal ideation.  HPI:  Tristan Valdez, 50 y.o., male patient seen via tele health by this provider, consulted with Dr. Nelly Rout; and chart reviewed on 10/07/20.  On evaluation Tristan Valdez reports he came to the hospital because he was having thoughts of hurting himself.  "I been having Valdez rough week and having thoughts of hurting myself, I've been wasted and depressed.  Patient states that he also needs help with substance use.  States he has been out of [facility] for 6 weeks and that he had been clean for 95 days but he needs something to assistance him with stressful situations.  Patient states that he is homeless and is interested in long term rehab and psychiatric services.  Patient is unable to contract for safety.   During evaluation Tristan Valdez is sitting in chair in no acute distress.  He is alert, oriented x 4, calm and cooperative.  His mood is depressed with congruent affect.  He does not appear to be responding to internal/external stimuli or delusional thoughts.  Patient denies suicidal/self-harm/homicidal ideation, psychosis, and paranoia.  Patient answered question appropriately.   Past Psychiatric History: See above  Risk to Self:  Yes Risk to Others:  No Prior Inpatient Therapy:  Yes Prior Outpatient Therapy:  Yes  Past Medical History:  Past Medical History:  Diagnosis Date   Hypertension    History  reviewed. No pertinent surgical history. Family History: History reviewed. No pertinent family history. Family Psychiatric  History: None reported Social History:  Social History   Substance and Sexual Activity  Alcohol Use None     Social History   Substance and Sexual Activity  Drug Use Not on file    Social History   Socioeconomic History   Marital status: Divorced    Spouse name: Not on file   Number of children: Not on file   Years of education: Not on file   Highest education level: Not on file  Occupational History   Not on file  Tobacco Use   Smoking status: Some Days    Pack years: 0.00    Types: Cigarettes   Smokeless tobacco: Never  Substance and Sexual Activity   Alcohol use: Not on file   Drug use: Not on file   Sexual activity: Not on file  Other Topics Concern   Not on file  Social History Narrative   Not on file   Social Determinants of Health   Financial Resource Strain: Not on file  Food Insecurity: Not on file  Transportation Needs: Not on file  Physical Activity: Not on file  Stress: Not on file  Social Connections: Not on file   Additional Social History:    Allergies:  No Known Allergies  Labs:  Results for orders placed or performed during the hospital encounter of 10/06/20 (from the past 48 hour(s))  Rapid urine drug screen (hospital performed)     Status: Abnormal   Collection Time: 10/06/20 11:51 PM  Result Value Ref Range   Opiates NONE DETECTED NONE DETECTED   Cocaine NONE DETECTED NONE DETECTED   Benzodiazepines NONE DETECTED NONE DETECTED   Amphetamines POSITIVE (Valdez) NONE DETECTED   Tetrahydrocannabinol NONE DETECTED NONE DETECTED   Barbiturates NONE DETECTED NONE DETECTED    Comment: (NOTE) DRUG SCREEN FOR MEDICAL PURPOSES ONLY.  IF CONFIRMATION IS NEEDED FOR ANY PURPOSE, NOTIFY LAB WITHIN 5 DAYS.  LOWEST DETECTABLE LIMITS FOR URINE DRUG SCREEN Drug Class                     Cutoff (ng/mL) Amphetamine and  metabolites    1000 Barbiturate and metabolites    200 Benzodiazepine                 200 Tricyclics and metabolites     300 Opiates and metabolites        300 Cocaine and metabolites        300 THC                            50 Performed at [facility], [address] [place], [place] [address]   CBC     Status: None   Collection Time: 10/06/20 11:59 PM  Result Value Ref Range   WBC 6.7 4.0 - 10.5 K/uL   RBC 4.47 4.22 - 5.81 MIL/uL   Hemoglobin 13.6 13.0 - 17.0 g/dL   HCT 29.542.3 28.439.0 - 13.252.0 %   MCV 94.6 80.0 - 100.0 fL   MCH 30.4 26.0 - 34.0 pg   MCHC 32.2 30.0 - 36.0 g/dL   RDW 44.012.7 10.211.5 - 72.515.5 %   Platelets 169 150 - 400 K/uL   nRBC 0.0 0.0 - 0.2 %    Comment: Performed at [facility], [address] [place], [place] [address]  Comprehensive metabolic panel     Status: Abnormal   Collection Time: 10/06/20 11:59 PM  Result Value Ref Range   Sodium 139 135 - 145 mmol/L   Potassium 3.3 (L) 3.5 - 5.1 mmol/L   Chloride 105 98 - 111 mmol/L   CO2 28 22 - 32 mmol/L   Glucose, Bld 114 (H) 70 - 99 mg/dL    Comment: Glucose reference range applies only to samples taken after fasting for at least 8 hours.   BUN 18 6 - 20 mg/dL   Creatinine, Ser 0.341.00 0.61 - 1.24 mg/dL   Calcium 8.9 8.9 - 74.210.3 mg/dL   Total Protein 6.0 (L) 6.5 - 8.1 g/dL   Albumin 3.4 (L) 3.5 - 5.0 g/dL   AST 19 15 - 41 U/L   ALT 25 0 - 44 U/L   Alkaline Phosphatase 51 38 - 126 U/L   Total Bilirubin 0.2 (L) 0.3 - 1.2 mg/dL   GFR, Estimated >59>60 >56>60 mL/min    Comment: (NOTE) Calculated using the CKD-EPI Creatinine Equation (2021)    Anion gap 6 5 - 15    Comment: Performed at [facility], [address] [place], [place] [address]  Ethanol     Status: None   Collection Time: 10/06/20 11:59 PM  Result Value Ref Range   Alcohol, Ethyl (B) <10 <10 mg/dL    Comment: (NOTE) Lowest detectable limit for serum alcohol is 10 mg/dL.  For medical purposes only. Performed at [facility], [address] [place], [place] [address]   Salicylate level  Status: Abnormal   Collection Time: 10/06/20 11:59 PM  Result Value Ref Range   Salicylate Lvl <7.0 (L) 7.0 - 30.0 mg/dL    Comment: Performed at [facility], [address], [place], [place] [address]  Acetaminophen level     Status: Abnormal   Collection Time: 10/06/20 11:59 PM  Result Value Ref Range   Acetaminophen (Tylenol), Serum <10 (L) 10 - 30 ug/mL    Comment: Performed at [facility], [address], [place], [place] [address]  Resp Panel by RT-PCR (Flu Valdez&B, Covid) Nasopharyngeal Swab     Status: None   Collection Time: 10/07/20 10:54 AM   Specimen: Nasopharyngeal Swab; Nasopharyngeal(NP) swabs in vial transport medium  Result Value Ref Range   SARS Coronavirus 2 by RT PCR NEGATIVE NEGATIVE    Comment: (NOTE) SARS-CoV-2 target nucleic acids are NOT DETECTED.  The SARS-CoV-2 RNA is generally detectable in upper respiratory specimens during the acute phase of infection. The lowest concentration of SARS-CoV-2 viral copies this assay can detect is 138 copies/mL. Valdez negative result does not preclude SARS-Cov-2 infection and should not be used as the sole basis for treatment or other patient management decisions. Valdez negative result may occur with  improper specimen collection/handling, submission of specimen other than nasopharyngeal swab, presence of viral mutation(s) within the areas targeted by this assay, and inadequate number of viral copies(<138 copies/mL). Valdez negative result must be combined with clinical observations, patient history, and epidemiological information. The expected result is Negative.  Fact Sheet for Patients:  BloggerCourse.com  Fact Sheet for Healthcare Providers:  SeriousBroker.it  This test is no t yet approved or cleared by the [place] FDA and  has been authorized for detection and/or diagnosis of SARS-CoV-2  by FDA under an Emergency Use Authorization (EUA). This EUA will remain  in effect (meaning this test can be used) for the duration of the COVID-19 declaration under Section 564(b)(1) of the Act, 21 U.S.C.section 360bbb-3(b)(1), unless the authorization is terminated  or revoked sooner.       Influenza Valdez by PCR NEGATIVE NEGATIVE   Influenza B by PCR NEGATIVE NEGATIVE    Comment: (NOTE) The Xpert Xpress SARS-CoV-2/FLU/RSV plus assay is intended as an aid in the diagnosis of influenza from Nasopharyngeal swab specimens and should not be used as Valdez sole basis for treatment. Nasal washings and aspirates are unacceptable for Xpert Xpress SARS-CoV-2/FLU/RSV testing.  Fact Sheet for Patients: BloggerCourse.com  Fact Sheet for Healthcare Providers: SeriousBroker.it  This test is not yet approved or cleared by the [place] FDA and has been authorized for detection and/or diagnosis of SARS-CoV-2 by FDA under an Emergency Use Authorization (EUA). This EUA will remain in effect (meaning this test can be used) for the duration of the COVID-19 declaration under Section 564(b)(1) of the Act, 21 U.S.C. section 360bbb-3(b)(1), unless the authorization is terminated or revoked.  Performed at [facility], [address] [place], [place] [address]     Medications:  Current Facility-Administered Medications  Medication Dose Route Frequency Provider Last Rate Last Admin   nicotine (NICODERM CQ - dosed in mg/24 hours) patch 21 mg  21 mg Transdermal Daily Tristan Valdez, Tristan A, PA-C   21 mg at 10/07/20 1152   No current outpatient medications on file.    Musculoskeletal: Strength & Muscle Tone: within normal limits Gait & Station: normal Patient leans: N/Valdez   Psychiatric Specialty Exam:  Presentation  General Appearance:  Appropriate for Environment; Casual Eye  Contact: Fair Speech: Blocked; Normal Rate Speech  Volume: Normal Handedness: Right  Mood and Affect  Mood: Depressed Affect: Congruent  Thought Process  Thought Processes: Coherent; Goal Directed Descriptions of Associations:Intact Orientation:Full (Time, Place and Person) Thought Content:WDL History of Schizophrenia/Schizoaffective disorder:No  Duration of Psychotic Symptoms:Less than six months  Hallucinations:Hallucinations: None Ideas of Reference:None Suicidal Thoughts:Suicidal Thoughts: Yes, Active SI Active Intent and/or Plan: Without Intent; Without Plan Homicidal Thoughts:Homicidal Thoughts: No  Sensorium  Memory: Immediate Good; Recent Good Judgment: Fair Insight: Fair  Executive Functions  Concentration: Good Attention Span: Good Recall: Good Fund of Knowledge: Good Language: Good  Psychomotor Activity  Psychomotor Activity: Psychomotor Activity: Normal  Assets  Assets: Communication Skills; Desire for Improvement; Leisure Time; Resilience  Sleep  Sleep: Sleep: Good   Physical Exam: Physical Exam Vitals and nursing note reviewed. Exam conducted with Valdez chaperone present.  Constitutional:      General: He is not in acute distress.    Appearance: Normal appearance. He is not ill-appearing.  Cardiovascular:     Rate and Rhythm: Normal rate.  Pulmonary:     Effort: Pulmonary effort is normal.  Neurological:     Mental Status: He is alert and oriented to person, place, and time.  Psychiatric:        Attention and Perception: Attention and perception normal. He does not perceive auditory or visual hallucinations.        Mood and Affect: Affect normal. Mood is depressed.        Speech: Speech normal.        Behavior: Behavior normal. Behavior is cooperative.        Thought Content: Thought content is not paranoid or delusional. Thought content includes suicidal ideation. Thought content does not include homicidal ideation.        Cognition and Memory: Cognition and memory normal.         Judgment: Judgment is impulsive.   Review of Systems  Constitutional: Negative.   HENT: Negative.    Eyes: Negative.   Respiratory: Negative.    Cardiovascular: Negative.   Gastrointestinal: Negative.   Genitourinary: Negative.   Musculoskeletal: Negative.   Skin: Negative.   Neurological: Negative.   Endo/Heme/Allergies: Negative.   Psychiatric/Behavioral:  Positive for depression, substance abuse and suicidal ideas. Negative for hallucinations. The patient is nervous/anxious.        Patient reports 3 prior suicide attempts and is currently unable to contract for safety   Blood pressure (!) 141/83, pulse (!) 53, temperature 98.3 F (36.8 C), temperature source Oral, resp. rate 16, SpO2 98 %. There is no height or weight on file to calculate BMI.  Treatment Plan Summary: Daily contact with patient to assess and evaluate symptoms and progress in treatment, Medication management, and Plan Inpatient psychiatric treatment  Disposition: Recommend psychiatric Inpatient admission when medically cleared.  This service was provided via telemedicine using Valdez 2-way, interactive audio and video technology.  Names of all persons participating in this telemedicine service and their role in this encounter. Name: Assunta Found Role: NP  Name: Dr. Nelly Rout Role: Psychiatrist   Name: Lottie Dawson Role: Patient  Name: Tristan Franklin, RN Role: Patients nurse sent Valdez secure message informing:  Patient recommended for inpatient psychiatric treatment.  Please inform MD only default listed.      Ilyanna Baillargeon, NP 10/07/2020 1:19 PM    Cyndia Diver, RN

## 2020-10-07 NOTE — Progress Notes (Addendum)
Patient is a  50 year old male  who voluntarily presented to [facility] on 10/07/20 from [facility] following thoughts of hurting himself. Patient reported he is homeless.  Pt reports he got into an argument with his girlfriend and he started seeing and hearing things that weren't there.  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 Pt repots his late fiance passed from Fentanyl OD. Patient presents with blunted affect but is pleasant and cooperative during assessment. Patient denies SI/HI at this time. Patient also denies AH/VH at this time. Provided positive reinforcement and encouragement. Patient cooperative and receptive to efforts. Patient remains safe on the unit.
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
## 2020-10-07 NOTE — ED Notes (Signed)
Attempted to give report x3. [facility] RN states she will call this RN back.

## 2020-10-08 ENCOUNTER — Encounter (HOSPITAL_COMMUNITY): Payer: Self-pay | Admitting: Registered Nurse

## 2020-10-08 DIAGNOSIS — F333 Major depressive disorder, recurrent, severe with psychotic symptoms: Principal | ICD-10-CM

## 2020-10-08 MED ORDER — NICOTINE 14 MG/24HR TD PT24
14.0000 mg | MEDICATED_PATCH | Freq: Every day | TRANSDERMAL | Status: DC
  Filled 2020-10-08 (×2): qty 1

## 2020-10-08 MED ORDER — NICOTINE 21 MG/24HR TD PT24
21.0000 mg | MEDICATED_PATCH | Freq: Every day | TRANSDERMAL | Status: DC
  Administered 2020-10-08 – 2020-10-13 (×6): 21 mg via TRANSDERMAL
  Filled 2020-10-08 (×9): qty 1

## 2020-10-08 MED ORDER — RISPERIDONE 1 MG PO TABS
1.0000 mg | ORAL_TABLET | Freq: Every day | ORAL | Status: DC
  Administered 2020-10-08 – 2020-10-13 (×6): 1 mg via ORAL
  Filled 2020-10-08 (×10): qty 1

## 2020-10-08 MED ORDER — RISPERIDONE 0.5 MG PO TABS
0.5000 mg | ORAL_TABLET | Freq: Every day | ORAL | Status: DC
  Administered 2020-10-08 – 2020-10-14 (×7): 0.5 mg via ORAL
  Filled 2020-10-08 (×2): qty 1
  Filled 2020-10-08: qty 21
  Filled 2020-10-08 (×6): qty 1

## 2020-10-08 MED ORDER — MIRTAZAPINE 15 MG PO TABS
15.0000 mg | ORAL_TABLET | Freq: Every day | ORAL | Status: DC
  Administered 2020-10-08 – 2020-10-13 (×6): 15 mg via ORAL
  Filled 2020-10-08 (×6): qty 1
  Filled 2020-10-08: qty 7
  Filled 2020-10-08 (×2): qty 1
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
## 2020-10-08 NOTE — Progress Notes (Signed)
Pt said that he was working and spending thousands of dollars on his girlfriend that cheated on him. Pt is sad and upset about this. He rated his anxiety and depression an 8 on a scale of 0-10 (10 being the worse) tonight. Pt said that his goal is to work on his codependency on others. He endorses [employer]. He said that the [facility] are hard to make out and the [facility] are moving objects. Pt reported trouble sleeping at night and a PRN trazodone order for sleep was obtained. 50 mg of trazodone was administered po at 2108 along with 25 mg of vistaril at 2108. He said that he wasn't taking any medications at home. Pt denies any withdrawal symptoms at this time. Pt denies SI/HI. Active listening, reassurance, and support provided. Medications administered as ordered by provider. Q 15 min safety checks continue. Pt's safety has been maintained.   10/07/20 2020  Psych Admission Type (Psych Patients Only)  Admission Status Voluntary  Psychosocial Assessment  Patient Complaints Anxiety;Depression;Sadness;Sleep disturbance;Worrying  Eye Contact Fair  Facial Expression Flat  Affect Anxious;Appropriate to circumstance;Depressed;Sad  Speech Logical/coherent  Interaction Assertive  Motor Activity Slow  Appearance/Hygiene In scrubs;Disheveled  Behavior Characteristics Cooperative;Appropriate to situation;Anxious  Mood Depressed;Anxious;Sad  Thought Process  Coherency WDL  Content Blaming others  Delusions None reported or observed  Perception Hallucinations  Hallucination Auditory;Visual  Judgment Poor  Confusion None  Danger to Self  Current suicidal ideation? Denies  Danger to Others  Danger to Others None reported or observed

## 2020-10-08 NOTE — Plan of Care (Signed)
  Problem: Coping: Goal: Ability to verbalize frustrations and anger appropriately will improve Outcome: Progressing   Problem: Physical Regulation: Goal: Ability to maintain clinical measurements within normal limits will improve Outcome: Progressing

## 2020-10-08 NOTE — [facility] Counselor (Signed)
[facility] LCSW Note  10/08/2020   3:30PM  Type of Contact and Topic:  PSA Attempt  CSW made second attempt to meet with pt in order to complete PSA. Pt declined requesting PSA be completed at a later time.  CSW team will continue efforts to complete PSA.    Leisa Lenz, LCSW 10/08/2020  4:12 PM

## 2020-10-08 NOTE — Progress Notes (Signed)
Recreation Therapy Notes  Date: 6.15.22 Time: 0930 Location: [address] Dayroom  Group Topic: Stress Management   Goal Area(s) Addresses:  Patient will actively participate in stress management techniques presented during session.  Patient will successfully identify benefit of practicing stress management post d/c.   Intervention: Guided exercise with ambient sound and script  Activity :Guided Imagery  LRT provided education, instruction, and demonstration on practice of visualization via guided imagery. Patient was asked to participate in the technique introduced during session. LRT also debriefed including topics of mindfulness, stress management and specific scenarios each patient could use these techniques. Patients were given suggestions of ways to access scripts post d/c and encouraged to explore Youtube and other apps available on smartphones, tablets, and computers.   Education:  Stress Management, Discharge Planning.   Education Outcome: Acknowledges education  Clinical Observations/Feedback: Patient did not attend group activity.     Caroll Rancher, LRT/CTRS         Caroll Rancher A 10/08/2020 11:38 AM

## 2020-10-08 NOTE — [facility] Counselor (Signed)
[facility] LCSW Note  10/08/2020   1:56 PM  Type of Contact and Topic:  PSA Attempt  CSW attempted to complete PSA with pt. Pt declined, requesting to be completed at a later time due to needing to sleep.  CSW team will continue efforts to complete PSA at a later time.    Leisa Lenz, LCSW 10/08/2020  1:56 PM

## 2020-10-08 NOTE — Progress Notes (Signed)
Psychoeducational Group Note  Date:  10/08/2020 Time:  2102  Group Topic/Focus:  Wrap-Up Group:   The focus of this group is to help patients review their daily goal of treatment and discuss progress on daily workbooks.  Participation Level: Did Not Attend  Participation Quality:  Not Applicable  Affect:  Not Applicable  Cognitive:  Not Applicable  Insight:  Not Applicable  Engagement in Group: Not Applicable  Additional Comments:  The patient did not attend group this evening.   Hazle Coca S 10/08/2020, 9:02 PM

## 2020-10-08 NOTE — Tx Team (Signed)
Interdisciplinary Treatment and Diagnostic Plan Update  10/08/2020 Time of Session: 9:55am  Layn Kye MRN: [id number]  Principal Diagnosis: <principal problem not specified>  Secondary Diagnoses: Active Problems:   MDD (major depressive disorder), recurrent episode, severe ([place])   Current Medications:  Current Facility-Administered Medications  Medication Dose Route Frequency Provider Last Rate Last Admin   acetaminophen (TYLENOL) tablet 650 mg  650 mg Oral Q6H PRN Sharma Covert, MD   650 mg at 10/07/20 2108   alum & mag hydroxide-simeth (MAALOX/MYLANTA) 200-200-20 MG/5ML suspension 30 mL  30 mL Oral Q4H PRN Sharma Covert, MD       cloNIDine (CATAPRES) tablet 0.1 mg  0.1 mg Oral TID PRN Sharma Covert, MD       dicyclomine (BENTYL) tablet 20 mg  20 mg Oral Q6H PRN Sharma Covert, MD       hydrOXYzine (ATARAX/VISTARIL) tablet 25 mg  25 mg Oral Q6H PRN Sharma Covert, MD   25 mg at 10/07/20 2108   loperamide (IMODIUM) capsule 2-4 mg  2-4 mg Oral PRN Sharma Covert, MD       magnesium hydroxide (MILK OF MAGNESIA) suspension 30 mL  30 mL Oral Daily PRN Sharma Covert, MD       methocarbamol (ROBAXIN) tablet 500 mg  500 mg Oral Q8H PRN Sharma Covert, MD       mirtazapine (REMERON) tablet 15 mg  15 mg Oral QHS Sharma Covert, MD       naproxen (NAPROSYN) tablet 500 mg  500 mg Oral BID PRN Sharma Covert, MD       nicotine (NICODERM CQ - dosed in mg/24 hours) patch 21 mg  21 mg Transdermal Daily Sharma Covert, MD   21 mg at 10/08/20 0756   ondansetron (ZOFRAN-ODT) disintegrating tablet 4 mg  4 mg Oral Q6H PRN Sharma Covert, MD       risperiDONE (RISPERDAL) tablet 0.5 mg  0.5 mg Oral Daily Sharma Covert, MD   0.5 mg at 10/08/20 1205   risperiDONE (RISPERDAL) tablet 1 mg  1 mg Oral QHS Sharma Covert, MD       traZODone (DESYREL) tablet 50 mg  50 mg Oral QHS PRN Margorie Marquise W, PA-C   50 mg at 10/07/20 2108   PTA  Medications: No medications prior to admission.    Patient Stressors:    Patient Strengths:    Treatment Modalities: Medication Management, Group therapy, Case management,  1 to 1 session with clinician, Psychoeducation, Recreational therapy.   Physician Treatment Plan for Primary Diagnosis: <principal problem not specified> Long Term Goal(s):     Short Term Goals:    Medication Management: Evaluate patient's response, side effects, and tolerance of medication regimen.  Therapeutic Interventions: 1 to 1 sessions, Unit Group sessions and Medication administration.  Evaluation of Outcomes: Not Met  Physician Treatment Plan for Secondary Diagnosis: Active Problems:   MDD (major depressive disorder), recurrent episode, severe ([place])  Long Term Goal(s):     Short Term Goals:       Medication Management: Evaluate patient's response, side effects, and tolerance of medication regimen.  Therapeutic Interventions: 1 to 1 sessions, Unit Group sessions and Medication administration.  Evaluation of Outcomes: Not Met   RN Treatment Plan for Primary Diagnosis: <principal problem not specified> Long Term Goal(s): Knowledge of disease and therapeutic regimen to maintain health will improve  Short Term Goals: Ability to remain free from injury will improve, Ability  to participate in decision making will improve, Ability to verbalize feelings will improve, Ability to disclose and discuss suicidal ideas, and Ability to identify and develop effective coping behaviors will improve  Medication Management: RN will administer medications as ordered by provider, will assess and evaluate patient's response and provide education to patient for prescribed medication. RN will report any adverse and/or side effects to prescribing provider.  Therapeutic Interventions: 1 on 1 counseling sessions, Psychoeducation, Medication administration, Evaluate responses to treatment, Monitor vital signs and CBGs as  ordered, Perform/monitor CIWA, COWS, AIMS and Fall Risk screenings as ordered, Perform wound care treatments as ordered.  Evaluation of Outcomes: Not Met   LCSW Treatment Plan for Primary Diagnosis: <principal problem not specified> Long Term Goal(s): Safe transition to appropriate next level of care at discharge, Engage patient in therapeutic group addressing interpersonal concerns.  Short Term Goals: Engage patient in aftercare planning with referrals and resources, Increase social support, Facilitate acceptance of mental health diagnosis and concerns, Facilitate patient progression through stages of change regarding substance use diagnoses and concerns, Identify triggers associated with mental health/substance abuse issues, and Increase skills for wellness and recovery  Therapeutic Interventions: Assess for all discharge needs, 1 to 1 time with Social worker, Explore available resources and support systems, Assess for adequacy in community support network, Educate family and significant other(s) on suicide prevention, Complete Psychosocial Assessment, Interpersonal group therapy.  Evaluation of Outcomes: Not Met   Progress in Treatment: Attending groups: No. Participating in groups: No. Taking medication as prescribed: Yes. Toleration medication: Yes. Family/Significant other contact made: Yes, individual(s) contacted:  If consents are provided Patient understands diagnosis: No. Discussing patient identified problems/goals with staff: Yes. Medical problems stabilized or resolved: Yes. Denies suicidal/homicidal ideation: Yes. Issues/concerns per patient self-inventory: No.   New problem(s) identified: No, Describe:  None   New Short Term/Long Term Goal(s): medication stabilization, elimination of SI thoughts, development of comprehensive mental wellness plan.   Patient Goals: "To get my mind right and to get housing"   Discharge Plan or Barriers: Patient recently admitted. CSW  will continue to follow and assess for appropriate referrals and possible discharge planning.   Reason for Continuation of Hospitalization: Depression Hallucinations Medication stabilization Suicidal ideation Withdrawal symptoms  Estimated Length of Stay: 3 to 5 days   Attendees: Patient: Tristan Valdez  10/08/2020   Physician: Myles Lipps, MD 10/08/2020   Nursing:  10/08/2020   RN Care Manager: 10/08/2020   Social Worker: Verdis Frederickson, [place]  10/08/2020   Recreational Therapist:  10/08/2020   Other:  10/08/2020   Other:  10/08/2020   Other: 10/08/2020     Scribe for Treatment Team: Darleen Crocker, [place] 10/08/2020 12:54 PM

## 2020-10-09 DIAGNOSIS — F1910 Other psychoactive substance abuse, uncomplicated: Secondary | ICD-10-CM

## 2020-10-09 NOTE — Progress Notes (Signed)
[facility] MD Progress Note  10/09/2020 5:33 PM Tristan Valdez  MRN:  [id number]  Reason for admission: Tristan Valdez is a 50 year old male who was admitted to [facility] from [facility] after presenting there for suicidal ideations and auditory and visual hallucinations. Patient has a history of substance misuse and states he recently relapsed on opiates, including heroin, fentanyl, methamphetamines.  Subjective:  Patient states "I will go to groups when I can stay awake."  Evaluation on the unit: Patient evaluated in his room, where he is laying in bed, awake but with eyes closed during entire interview. He responds appropriately to questions, saying he is "coming off of meth." Patient denies SI/HI at this time. He denies auditory hallucinations, but states he saw "shadows" of people at lunchtime. Patient has not attended groups yet, but says he will when he can stay awake.   Principal Problem: MDD (major depressive disorder), recurrent episode, severe ([facility]) Diagnosis: Principal Problem:   MDD (major depressive disorder), recurrent episode, severe ([facility]) Active Problems:   Polysubstance abuse ([facility])  Total Time spent with patient: 15 minutes  Past Psychiatric History: Per H&P:"Patient stated he had 1 previous psychiatric admission.  He has been admitted for detox and also for substance rehabilitation in the past.  He apparently was at [facility] several months ago, and does not want to return to that facility."  Past Medical History:  Past Medical History:  Diagnosis Date   Arthritis    Bilateral club feet    Hypertension     Past Surgical History:  Procedure Laterality Date   APPENDECTOMY     reconstruction of tendons of left arm Left    Family History: History reviewed. No pertinent family history.  Family Psychiatric  History: Per H&P: "Noncontributory"  Social History:  Social History   Substance and Sexual Activity  Alcohol Use Never     Social History   Substance and Sexual Activity   Drug Use Yes   Types: Methamphetamines   Comment: last use 3 days ago    Social History   Socioeconomic History   Marital status: Divorced    Spouse name: Not on file   Number of children: Not on file   Years of education: Not on file   Highest education level: Not on file  Occupational History   Not on file  Tobacco Use   Smoking status: Every Day    Packs/day: 1.00    Pack years: 0.00    Types: Cigarettes   Smokeless tobacco: Never   Tobacco comments:    Last used: 2 days ago  Vaping Use   Vaping Use: Some days  Substance and Sexual Activity   Alcohol use: Never   Drug use: Yes    Types: Methamphetamines    Comment: last use 3 days ago   Sexual activity: Not Currently  Other Topics Concern   Not on file  Social History Narrative   Not on file   Social Determinants of Health   Financial Resource Strain: Not on file  Food Insecurity: Not on file  Transportation Needs: Not on file  Physical Activity: Not on file  Stress: Not on file  Social Connections: Not on file   Additional Social History:    Sleep: Good  Appetite:  Fair  Current Medications: Current Facility-Administered Medications  Medication Dose Route Frequency Provider Last Rate Last Admin   acetaminophen (TYLENOL) tablet 650 mg  650 mg Oral Q6H PRN Antonieta Pert, MD   650 mg at 10/09/20 1620  alum & mag hydroxide-simeth (MAALOX/MYLANTA) 200-200-20 MG/5ML suspension 30 mL  30 mL Oral Q4H PRN Antonieta Pert, MD       cloNIDine (CATAPRES) tablet 0.1 mg  0.1 mg Oral TID PRN Antonieta Pert, MD       dicyclomine (BENTYL) tablet 20 mg  20 mg Oral Q6H PRN Antonieta Pert, MD   20 mg at 10/09/20 1620   hydrOXYzine (ATARAX/VISTARIL) tablet 25 mg  25 mg Oral Q6H PRN Antonieta Pert, MD   25 mg at 10/09/20 1620   loperamide (IMODIUM) capsule 2-4 mg  2-4 mg Oral PRN Antonieta Pert, MD       magnesium hydroxide (MILK OF MAGNESIA) suspension 30 mL  30 mL Oral Daily PRN Antonieta Pert, MD       methocarbamol (ROBAXIN) tablet 500 mg  500 mg Oral Q8H PRN Antonieta Pert, MD   500 mg at 10/09/20 1620   mirtazapine (REMERON) tablet 15 mg  15 mg Oral QHS Antonieta Pert, MD   15 mg at 10/08/20 2133   naproxen (NAPROSYN) tablet 500 mg  500 mg Oral BID PRN Antonieta Pert, MD   500 mg at 10/09/20 1620   nicotine (NICODERM CQ - dosed in mg/24 hours) patch 21 mg  21 mg Transdermal Daily Antonieta Pert, MD   21 mg at 10/09/20 0746   ondansetron (ZOFRAN-ODT) disintegrating tablet 4 mg  4 mg Oral Q6H PRN Antonieta Pert, MD   4 mg at 10/09/20 1620   risperiDONE (RISPERDAL) tablet 0.5 mg  0.5 mg Oral Daily Antonieta Pert, MD   0.5 mg at 10/09/20 0747   risperiDONE (RISPERDAL) tablet 1 mg  1 mg Oral QHS Antonieta Pert, MD   1 mg at 10/08/20 2133   traZODone (DESYREL) tablet 50 mg  50 mg Oral QHS PRN Jaclyn Shaggy, PA-C   50 mg at 10/07/20 2108    Lab Results: No results found for this or any previous visit (from the past 48 hour(s)).  Blood Alcohol level:  Lab Results  Component Value Date   ETH <10 10/06/2020   ETH <10 08/19/2020    Metabolic Disorder Labs: No results found for: HGBA1C, MPG No results found for: PROLACTIN No results found for: CHOL, TRIG, HDL, CHOLHDL, VLDL, LDLCALC  Physical Findings: AIMS: Facial and Oral Movements Muscles of Facial Expression: None, normal Lips and Perioral Area: None, normal Jaw: None, normal Tongue: None, normal,Extremity Movements Upper (arms, wrists, hands, fingers): None, normal Lower (legs, knees, ankles, toes): None, normal, Trunk Movements Neck, shoulders, hips: None, normal, Overall Severity Severity of abnormal movements (highest score from questions above): None, normal Incapacitation due to abnormal movements: None, normal Patient's awareness of abnormal movements (rate only patient's report): No Awareness, Dental Status Current problems with teeth and/or dentures?: No Does patient usually  wear dentures?: No  CIWA:    COWS:  COWS Total Score: 5  Musculoskeletal: Strength & Muscle Tone: within normal limits Gait & Station: normal Patient leans: N/A  Psychiatric Specialty Exam:  Presentation  General Appearance: Disheveled  Eye Contact:Absent (sleepy, answered questions with eyes closed)  Speech:Clear and Coherent  Speech Volume:Increased  Handedness:Right   Mood and Affect  Mood:Dysphoric  Affect:Flat; Congruent   Thought Process  Thought Processes:Coherent  Descriptions of Associations:Intact  Orientation:Full (Time, Place and Person)  Thought Content:Logical  History of Schizophrenia/Schizoaffective disorder:No  Duration of Psychotic Symptoms:Greater than six months  Hallucinations:Hallucinations: Visual ("Sees shadows" today) Description  of Visual Hallucinations: "shadows of people"  Ideas of Reference:None  Suicidal Thoughts:Suicidal Thoughts: No (Denies) SI Active Intent and/or Plan: Without Intent  Homicidal Thoughts:Homicidal Thoughts: No (Denies)   Sensorium  Memory:Immediate Fair; Recent Fair; Remote Fair  Judgment:Impaired  Insight:Lacking   Executive Functions  Concentration:Fair  Attention Span:Fair  Recall:Fair  Fund of Knowledge:Fair  Language:Fair   Psychomotor Activity  Psychomotor Activity:Psychomotor Activity: Increased   Assets  Assets:Desire for Improvement; Resilience   Sleep  Sleep:Number of Hours of Sleep: 6.75    Physical Exam: Physical Exam Vitals and nursing note reviewed.  Constitutional:      Comments: Sleepy, didn't open eyes during interview   HENT:     Head: Normocephalic.     Nose: No congestion or rhinorrhea.  Eyes:     General:        Right eye: No discharge.        Left eye: No discharge.  Pulmonary:     Effort: Pulmonary effort is normal.  Musculoskeletal:        General: Normal range of motion.     Cervical back: Normal range of motion.  Neurological:     Mental  Status: He is oriented to person, place, and time.   Review of Systems  Psychiatric/Behavioral:  Positive for depression, hallucinations (Sees shadows) and substance abuse (Hx of). Negative for memory loss and suicidal ideas. The patient is not nervous/anxious and does not have insomnia.   All other systems reviewed and are negative. Blood pressure 125/68, pulse 68, temperature 97.7 F (36.5 C), temperature source Oral, resp. rate 17, height 6\' 1"  (1.854 m), weight 76.7 kg, SpO2 99 %. Body mass index is 22.3 kg/m.   Treatment Plan Summary:  10/09/2020 Update: Continue plan as outlined below. Patient is stable and med compliant. No medication changes. Ordered EKG   Daily contact with patient to assess and evaluate symptoms and progress in treatment and Medication management  #Mood Control/Depression -Continue Risperdal 1 mg at bedtime -Continue Risperdal 0.5 mg daily in the morning   #Insomnia -Continue Remeron 15 mg daily at bedtime -Continue trazodone 50 mg at bedtime prn sleep  #Hypertension -Continue Catapres 0.1 mg 3 times daily prn, SBP >150  #Muscle Spasms -Continue Bentyl tab 20 mg every 6 hrs prn  #Anxiety -Continue hydroxyzine 25 mg every 6 hours prn anxiety  #Nicotine replacement for cessation/cravings -Continue Nicoderm CQ 21 mg daily in morning  #PRNs, other  --Continue Tylenol 650 mg po every 6 hrs prn, mild pain --Continue MAALOX/MYLANTA 30 mL po every 4 hrs prn indigestion --Continue Milk of Magnesia 30 mL po daily prn mild constipation     --Continue Imodium 2-4 mg prn diarrhea or loose stools (end 6/20) --Continue Robaxin 500 mg every 8 hrs prn muscle spasms --Continue Naprosyn 500 mg 2 times daily prn aching, pain, discomfort --Continue Zofran ODT 4 mg every 6 hours prn nausea, vomiting   7/20, NP, PMHNP-BC 10/09/2020, 5:33 PM

## 2020-10-09 NOTE — Progress Notes (Signed)
Progress Notes:   10/09/20 1100  Psych Admission Type (Psych Patients Only)  Admission Status Voluntary  Psychosocial Assessment  Patient Complaints Depression;Sadness  Eye Contact Fair  Facial Expression Flat  Affect Appropriate to circumstance  Speech Logical/coherent  Interaction Assertive;Guarded;Minimal  Motor Activity Slow  Appearance/Hygiene In scrubs  Behavior Characteristics Cooperative;Appropriate to situation;Anxious  Mood Anxious;Sad  Thought Process  Coherency WDL  Content WDL  Delusions None reported or observed  Perception WDL  Hallucination None reported or observed  Judgment Poor  Confusion None  Danger to Self  Current suicidal ideation? Denies  Danger to Others  Danger to Others None reported or observed

## 2020-10-09 NOTE — Progress Notes (Signed)
D: Patient is guarded and minimal upon assessment but cooperative at time of assessment. Patient denies SI/HI at this time. Patient also denies AH/VH at this time. Patient contracts for safety.  A: Provided positive reinforcement and encouragement.  R: Patient cooperative and receptive to efforts. Patient remains safe on the unit.   10/08/20 2133  Psych Admission Type (Psych Patients Only)  Admission Status Voluntary  Psychosocial Assessment  Patient Complaints Depression;Sadness  Eye Contact Fair  Facial Expression Flat  Affect Appropriate to circumstance  Speech Logical/coherent  Interaction Assertive;Guarded;Minimal  Motor Activity Slow  Appearance/Hygiene Disheveled;In scrubs  Behavior Characteristics Cooperative;Appropriate to situation  Mood Anxious;Sad  Thought Process  Coherency WDL  Content Blaming others  Delusions None reported or observed  Perception WDL  Hallucination None reported or observed  Judgment Poor  Confusion None  Danger to Self  Current suicidal ideation? Denies  Danger to Others  Danger to Others None reported or observed

## 2020-10-09 NOTE — [facility] Counselor (Signed)
CSW provided the patient with a packet of resources that contains: housing resources, free and reduced cost food resources, suicide prevention resources and numbers, and [employer] cards.  CSW also provided the client with a list of residential treatment options for substance use.  

## 2020-10-09 NOTE — [facility] Group Notes (Signed)
[facility] Group Notes: (Nursing/MHT/Case Management/Adjunct)   Date:  10/09/2020  Time:  9:00 AM   Type of Therapy:  Goals group   Participation Level:  Did Not Attend   Participation Quality:     Affect:     Cognitive:     Insight:     Engagement in Group:     Modes of Intervention:  Discussion and Education   Summary of Progress/Problems:  Did not attend despite staff encouragement.     Tekoa Amon V Aramis Weil 10/09/2020 9:00 AM 

## 2020-10-09 NOTE — Progress Notes (Signed)
Psychoeducational Group Note  Date:  10/09/2020 Time:  2015  Group Topic/Focus:  Wrap up group  Participation Level: Did Not Attend  Participation Quality:  Not Applicable  Affect:  Not Applicable  Cognitive:  Not Applicable  Insight:  Not Applicable  Engagement in Group: Not Applicable  Additional Comments:  Did not attend.   Marcille Buffy 10/09/2020, 10:38 PM
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
## 2020-10-10 NOTE — Progress Notes (Signed)
The patient attended a portion of the evening A.A. meeting and was appropriate.

## 2020-10-10 NOTE — Progress Notes (Signed)
Nursing Progress Note   10/10/20 0000  Clinical Opiate Withdrawal Scale (COWS)  Resting Pulse Rate 0  Sweating 1  Restlessness 1  Pupil Size 0  Bone or Joint Aches 0  Runny Nose or Tearing 1  GI Upset 0  Tremor 1  Yawning 0  Anxiety or Irritability 1  Gooseflesh Skin 0  COWS Total Score 5   Patient remains guarded and minimal and isolative in his room. Denies SI/HI/A/AV/H and verbally contracted for safety.  Compliant with medications. Q 15 minutes safety checks ongoing without self harm gestures.  Support and encouragement provided as needed. No adverse drug noted.

## 2020-10-10 NOTE — Progress Notes (Signed)
Progress Note: Pt is guarded on approach.  Pt spent more time out of his room today than he did yesterday.  Pt took some naps during the day.  Pt denies SI/HI/AVH.  RN assessed for needs and concerns and provided support. Pt remains safe with q 15 min room checks in place.   10/10/20 1500  Psych Admission Type (Psych Patients Only)  Admission Status Voluntary  Psychosocial Assessment  Patient Complaints Depression  Eye Contact Fair  Facial Expression Flat  Affect Appropriate to circumstance  Speech Logical/coherent  Interaction Assertive;Guarded;Minimal  Motor Activity Slow  Appearance/Hygiene In scrubs  Behavior Characteristics Anxious  Mood Depressed;Sad  Thought Process  Coherency WDL  Content WDL  Delusions None reported or observed  Perception WDL  Hallucination None reported or observed  Judgment Poor  Confusion None  Danger to Self  Current suicidal ideation? Denies  Danger to Others  Danger to Others None reported or observed

## 2020-10-10 NOTE — Progress Notes (Signed)
Recreation Therapy Notes  Date:  6.17.22 Time: 0930 Location: [address] Hall Dayroom  Group Topic: Stress Management  Goal Area(s) Addresses:  Patient will identify positive stress management techniques. Patient will identify benefits of using stress management post d/c.  Intervention: Stress Management  Activity :  Meditation.  LRT played on meditation that focused on looking at each day as a new opportunity to do something new and be productive.  Education:  Stress Management, Discharge Planning.   Education Outcome: Acknowledges Education  Clinical Observations/Feedback:  Pt did not attend group session.    Mayur Duman, LRT/CTRS         Quintavius Niebuhr A 10/10/2020 11:27 AM 

## 2020-10-10 NOTE — Progress Notes (Signed)
D: Patient presents with appropriate affect but is minimal and guarded during interaction. Patient denies SI/HI at this time. Patient denies VH but reports AH of "negative voices" telling him that he should give up on life. Patient reports that the voices have decreased but are still present. Patient denies these AH are command in nature and contracts for safety.  A: Provided positive reinforcement and encouragement.  R: Patient cooperative and receptive to efforts. Patient remains safe on the unit.   10/10/20 2118  Psych Admission Type (Psych Patients Only)  Admission Status Voluntary  Psychosocial Assessment  Patient Complaints Depression;Anxiety  Eye Contact Brief;Fair  Facial Expression Flat  Affect Appropriate to circumstance  Speech Logical/coherent  Interaction Assertive;Guarded;Minimal  Motor Activity Slow  Appearance/Hygiene In scrubs  Behavior Characteristics Cooperative;Appropriate to situation  Mood Depressed;Sad;Anxious  Thought Process  Coherency WDL  Content WDL  Delusions None reported or observed  Perception Hallucinations  Hallucination Auditory  Judgment Poor  Confusion None  Danger to Self  Current suicidal ideation? Denies  Danger to Others  Danger to Others None reported or observed
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
## 2020-10-10 NOTE — [facility] Group Notes (Signed)
[facility] Group Notes:  (Nursing/MHT/Case Management/Adjunct)   Date:  10/10/2020  Time:  11:55 AM   Type of Therapy:   Goals group   Participation Level:  Did Not Attend   Participation Quality:   Did not attend   Affect:       Cognitive:       Insight:     Engagement in Group:   Did not attend   Modes of Intervention:  Discussion and Education   Summary of Progress/Problems:   Tristan Valdez V [name] 10/10/2020, 11:55 AM 

## 2020-10-10 NOTE — [facility] Group Notes (Signed)
[facility] Group Notes:  (Nursing/MHT/Case Management/Adjunct)   Date:  10/10/2020  Time:  12:58 PM   Type of Therapy:  Group Therapy   Participation Level:  Active   Participation Quality:  Appropriate, Attentive, and Sharing   Affect:  Anxious   Cognitive:  Alert and Appropriate   Insight:  Improving   Engagement in Group:  Engaged and Supportive   Modes of Intervention:  Activity and Rapport Building   Summary of Progress/Problems:  Tristan Valdez was able to participate with the group activity.  He shared two true things about himself and one that is not true.  The group was able to pick what was the untrue statements.   Tristan Valdez Tristan Valdez 10/10/2020, 12:58 PM

## 2020-10-11 LAB — LIPID PANEL
Cholesterol: 150 mg/dL (ref 0–200)
HDL: 35 mg/dL — ABNORMAL LOW (ref >40)
LDL Cholesterol: 80 mg/dL (ref 0–99)
Total CHOL/HDL Ratio: 4.3 RATIO
Triglycerides: 173 mg/dL — ABNORMAL HIGH (ref <150)
VLDL: 35 mg/dL (ref 0–40)

## 2020-10-11 LAB — TSH: TSH: 1.096 u[IU]/mL (ref 0.350–4.500)

## 2020-10-11 NOTE — Progress Notes (Signed)
   10/11/20 1000  Psych Admission Type (Psych Patients Only)  Admission Status Voluntary  Psychosocial Assessment  Patient Complaints Depression  Eye Contact Brief;Fair  Facial Expression Flat  Affect Appropriate to circumstance  Speech Logical/coherent  Interaction Assertive;Guarded;Minimal  Motor Activity Slow  Appearance/Hygiene In scrubs  Behavior Characteristics Cooperative;Appropriate to situation  Mood Depressed  Thought Process  Coherency WDL  Content WDL  Delusions None reported or observed  Perception Hallucinations  Hallucination Auditory  Judgment Poor  Confusion None  Danger to Self  Current suicidal ideation? Denies  Danger to Others  Danger to Others None reported or observed

## 2020-10-11 NOTE — Progress Notes (Signed)
Psychoeducational Group Note  Date:  10/11/2020 Time:  2015  Group Topic/Focus:  Wrap up group  Participation Level: Did Not Attend  Participation Quality:  Not Applicable  Affect:  Not Applicable  Cognitive:  Not Applicable  Insight:  Not Applicable  Engagement in Group: Not Applicable  Additional Comments:  Did not attend.   Marcille Buffy 10/11/2020, 9:18 PM

## 2020-10-11 NOTE — [facility] Group Notes (Signed)
[facility] Group Notes:  (Nursing/MHT/Case Management/Adjunct)  Date:  10/11/2020  Time:  9:50 AM  Type of Therapy:  Goals group  Participation Level:  Did Not Attend  Participation Quality:     Affect:    Cognitive:    Insight:    Engagement in Group:    Modes of Intervention:  Discussion and Education  Summary of Progress/Problems:   Did not attend despite staff encouragement.  Shravya Wickwire V Zareen Jamison 10/11/2020, 9:50 AM  
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
## 2020-10-11 NOTE — [facility] Group Notes (Signed)
[facility] Group Notes: (Clinical Social Work)   10/11/2020      Type of Therapy:  Group Therapy   Participation Level:  Did Not Attend - was invited both individually by MHT and by overhead announcement, chose not to attend.   Ambrose Mantle, LCSW 10/11/2020, 1:52 PM

## 2020-10-11 NOTE — Progress Notes (Signed)
[place] NOVEL CORONAVIRUS (COVID-19) DAILY CHECK-OFF SYMPTOMS - answer yes or no to each - every day NO YES  Have you had a fever in the past 24 hours?  . Fever (Temp > 37.80C / 100F) X   Have you had any of these symptoms in the past 24 hours? . New Cough .  Sore Throat  .  Shortness of Breath .  Difficulty Breathing .  Unexplained Body Aches   X   Have you had any one of these symptoms in the past 24 hours not related to allergies?   . Runny Nose .  Nasal Congestion .  Sneezing   X   If you have had runny nose, nasal congestion, sneezing in the past 24 hours, has it worsened?  X   EXPOSURES - check yes or no X   Have you traveled outside the state in the past 14 days?  X   Have you been in contact with someone with a confirmed diagnosis of COVID-19 or PUI in the past 14 days without wearing appropriate PPE?  X   Have you been living in the same home as a person with confirmed diagnosis of COVID-19 or a PUI (household contact)?    X   Have you been diagnosed with COVID-19?    X              What to do next: Answered NO to all: Answered YES to anything:   Proceed with unit schedule Follow the [facility] Inpatient Flowsheet.   

## 2020-10-12 NOTE — [facility] Group Notes (Signed)
[facility] LCSW Group Therapy Note  10/12/2020  10:00-11:00AM  Type of Therapy and Topic:  Group Therapy:  Acknowledging and Resolving Issues with Fathers  Participation Level:  Did Not Attend   Description of Group:   Patients in this group were asked to briefly describe their experience with the father figure(s) in their lives, both in childhood and adulthood.  Different types of support provided by these individuals were identified.   Patients were then encouraged to determine whether their father figure was or is a healthy or unhealthy support.  The manner in which that early relationship has shaped patient's feelings and life decisions was pointed out and acknowledged.  Group members gave support to each other.  CSW led a discussion on how helpful it can be to resolve past issues, and how this can be done whether the father figure is now alive or already deceased.  An emphasis was placed on continuing to work with a therapist on these issues  when patients leave the hospital in order to be able to focus on the future instead of the past, to continue becoming healthier and happier.   Therapeutic Goals: 1)  discuss the possibility of father figure(s) being positive and/or negative in one's life, normalizing that some people never had positive experiences with "paternal" persons  2)  describe patient's specific example of father figure(s), allowing time to vent  3)  identify the patient's current need for resolution in the relationship with the aforementioned person  4)  elicit commitments to work on resolving feelings about father figure(s) in order to move forward in life and wellness   Summary of Patient Progress:  The patient was invited to group by overhead announcement and in person invitation, chose not to attend.   Therapeutic Modalities:   Processing Brief Solution-Focused Therapy  Lynnell Chad

## 2020-10-12 NOTE — [facility] Group Notes (Signed)
Patient did not attend morning orientation and goal setting group.  

## 2020-10-12 NOTE — Progress Notes (Signed)
[facility] Group Notes:  (Nursing/MHT/Case Management/Adjunct)  Date:  10/12/2020  Time:  2015 Type of Therapy:   wrap up group  Participation Level:  Active  Participation Quality:  Appropriate, Attentive, Sharing, and Supportive  Affect:  Depressed  Cognitive:  Alert  Insight:  Improving  Engagement in Group:  Engaged  Modes of Intervention:  Clarification, Education, and Support  Summary of Progress/Problems: Positive thinking self-care were discussed.   Tristan Valdez 10/12/2020, 9:58 PM

## 2020-10-12 NOTE — Plan of Care (Signed)
  Problem: Activity: Goal: Interest or engagement in activities will improve Outcome: Not Progressing   Problem: Role Relationship: Goal: Will demonstrate positive changes in social behaviors and relationships Outcome: Not Progressing   Problem: Self-Concept: Goal: Will verbalize positive feelings about self Outcome: Not Progressing

## 2020-10-12 NOTE — Progress Notes (Signed)
[place] NOVEL CORONAVIRUS (COVID-19) DAILY CHECK-OFF SYMPTOMS - answer yes or no to each - every day NO YES  Have you had a fever in the past 24 hours?  . Fever (Temp > 37.80C / 100F) X   Have you had any of these symptoms in the past 24 hours? . New Cough .  Sore Throat  .  Shortness of Breath .  Difficulty Breathing .  Unexplained Body Aches   X   Have you had any one of these symptoms in the past 24 hours not related to allergies?   . Runny Nose .  Nasal Congestion .  Sneezing   X   If you have had runny nose, nasal congestion, sneezing in the past 24 hours, has it worsened?  X   EXPOSURES - check yes or no X   Have you traveled outside the state in the past 14 days?  X   Have you been in contact with someone with a confirmed diagnosis of COVID-19 or PUI in the past 14 days without wearing appropriate PPE?  X   Have you been living in the same home as a person with confirmed diagnosis of COVID-19 or a PUI (household contact)?    X   Have you been diagnosed with COVID-19?    X              What to do next: Answered NO to all: Answered YES to anything:   Proceed with unit schedule Follow the [facility] Inpatient Flowsheet.   
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
## 2020-10-12 NOTE — Plan of Care (Signed)
Nurse discussed anxiety, depression and coping skills with patient.  

## 2020-10-12 NOTE — Progress Notes (Addendum)
   D:  Pt presents with moderate anxiety and depression.  Administered PRN Vistaril and Trazodone per [facility], per pt request.  Pt reports he stayed in room most of the day with the exception of meals.  Pt encourage to attend group tomorrow.  Pt reports he has not spoken to his children in 3 years.  Pt denies SI/HI, but verbally contracts for safety should the thoughts arise.  Pt denies AVH.  A:  Labs/Vitals monitored; Medication education provided; Pt encouraged to communicate his concerns.  R:  Pt remains safe on unit with Q 15 minute safety checks.  Will continue POC.     10/11/20 2210  Psych Admission Type (Psych Patients Only)  Admission Status Voluntary  Psychosocial Assessment  Patient Complaints Depression;Anxiety  Eye Contact Brief  Facial Expression Flat  Affect Appropriate to circumstance  Speech Logical/coherent  Interaction Assertive;Guarded;Minimal  Motor Activity Slow  Appearance/Hygiene In scrubs  Behavior Characteristics Cooperative;Appropriate to situation  Mood Depressed  Thought Process  Coherency WDL  Content WDL  Delusions None reported or observed  Perception Hallucinations  Hallucination None reported or observed  Judgment Poor  Confusion None  Danger to Self  Current suicidal ideation? Denies  Danger to Others  Danger to Others None reported or observed

## 2020-10-12 NOTE — Progress Notes (Signed)
   10/12/20 1100  Psych Admission Type (Psych Patients Only)  Admission Status Voluntary  Psychosocial Assessment  Patient Complaints Sleep disturbance  Eye Contact Brief  Facial Expression Flat  Affect Appropriate to circumstance  Speech Logical/coherent  Interaction Guarded;Minimal;Isolative  Motor Activity Slow  Appearance/Hygiene In scrubs  Behavior Characteristics Cooperative;Calm  Mood Depressed  Thought Process  Coherency WDL  Content WDL  Delusions None reported or observed  Perception WDL  Hallucination None reported or observed  Judgment Poor  Confusion None  Danger to Self  Current suicidal ideation? Denies  Danger to Others  Danger to Others None reported or observed

## 2020-10-13 LAB — HEMOGLOBIN A1C
Hgb A1c MFr Bld: 5.8 % — ABNORMAL HIGH (ref 4.8–5.6)
Mean Plasma Glucose: 120 mg/dL
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
## 2020-10-13 NOTE — Tx Team (Signed)
Interdisciplinary Treatment and Diagnostic Plan Update  10/13/2020 Time of Session: 9:55am  Tristan Valdez MRN: [id number]  Principal Diagnosis: MDD (major depressive disorder), recurrent episode, severe (HCC)  Secondary Diagnoses: Principal Problem:   MDD (major depressive disorder), recurrent episode, severe (HCC) Active Problems:   Polysubstance abuse (HCC)   Current Medications:  Current Facility-Administered Medications  Medication Dose Route Frequency Provider Last Rate Last Admin   acetaminophen (TYLENOL) tablet 650 mg  650 mg Oral Q6H PRN Antonieta Pert, MD   650 mg at 10/09/20 1620   alum & mag hydroxide-simeth (MAALOX/MYLANTA) 200-200-20 MG/5ML suspension 30 mL  30 mL Oral Q4H PRN Antonieta Pert, MD       cloNIDine (CATAPRES) tablet 0.1 mg  0.1 mg Oral TID PRN Antonieta Pert, MD       magnesium hydroxide (MILK OF MAGNESIA) suspension 30 mL  30 mL Oral Daily PRN Antonieta Pert, MD       mirtazapine (REMERON) tablet 15 mg  15 mg Oral QHS Antonieta Pert, MD   15 mg at 10/12/20 2057   nicotine (NICODERM CQ - dosed in mg/24 hours) patch 21 mg  21 mg Transdermal Daily Antonieta Pert, MD   21 mg at 10/13/20 0915   risperiDONE (RISPERDAL) tablet 0.5 mg  0.5 mg Oral Daily Antonieta Pert, MD   0.5 mg at 10/13/20 0914   risperiDONE (RISPERDAL) tablet 1 mg  1 mg Oral QHS Antonieta Pert, MD   1 mg at 10/12/20 2057   traZODone (DESYREL) tablet 50 mg  50 mg Oral QHS PRN Jaclyn Shaggy, PA-C   50 mg at 10/11/20 2207   PTA Medications: No medications prior to admission.    Patient Stressors:    Patient Strengths:    Treatment Modalities: Medication Management, Group therapy, Case management,  1 to 1 session with clinician, Psychoeducation, Recreational therapy.   Physician Treatment Plan for Primary Diagnosis: MDD (major depressive disorder), recurrent episode, severe (HCC) Long Term Goal(s): Improvement in symptoms so as ready for discharge   Short  Term Goals: Ability to identify changes in lifestyle to reduce recurrence of condition will improve Ability to verbalize feelings will improve Ability to disclose and discuss suicidal ideas Ability to demonstrate self-control will improve Ability to identify and develop effective coping behaviors will improve Ability to maintain clinical measurements within normal limits will improve Compliance with prescribed medications will improve Ability to identify triggers associated with substance abuse/mental health issues will improve  Medication Management: Evaluate patient's response, side effects, and tolerance of medication regimen.  Therapeutic Interventions: 1 to 1 sessions, Unit Group sessions and Medication administration.  Evaluation of Outcomes: Progressing  Physician Treatment Plan for Secondary Diagnosis: Principal Problem:   MDD (major depressive disorder), recurrent episode, severe (HCC) Active Problems:   Polysubstance abuse (HCC)  Long Term Goal(s): Improvement in symptoms so as ready for discharge   Short Term Goals: Ability to identify changes in lifestyle to reduce recurrence of condition will improve Ability to verbalize feelings will improve Ability to disclose and discuss suicidal ideas Ability to demonstrate self-control will improve Ability to identify and develop effective coping behaviors will improve Ability to maintain clinical measurements within normal limits will improve Compliance with prescribed medications will improve Ability to identify triggers associated with substance abuse/mental health issues will improve     Medication Management: Evaluate patient's response, side effects, and tolerance of medication regimen.  Therapeutic Interventions: 1 to 1 sessions, Unit Group sessions and Medication  administration.  Evaluation of Outcomes: Progressing   RN Treatment Plan for Primary Diagnosis: MDD (major depressive disorder), recurrent episode, severe  (HCC) Long Term Goal(s): Knowledge of disease and therapeutic regimen to maintain health will improve  Short Term Goals: Ability to remain free from injury will improve, Ability to participate in decision making will improve, Ability to verbalize feelings will improve, Ability to disclose and discuss suicidal ideas, and Ability to identify and develop effective coping behaviors will improve  Medication Management: RN will administer medications as ordered by provider, will assess and evaluate patient's response and provide education to patient for prescribed medication. RN will report any adverse and/or side effects to prescribing provider.  Therapeutic Interventions: 1 on 1 counseling sessions, Psychoeducation, Medication administration, Evaluate responses to treatment, Monitor vital signs and CBGs as ordered, Perform/monitor CIWA, COWS, AIMS and Fall Risk screenings as ordered, Perform wound care treatments as ordered.  Evaluation of Outcomes: Progressing   LCSW Treatment Plan for Primary Diagnosis: MDD (major depressive disorder), recurrent episode, severe (HCC) Long Term Goal(s): Safe transition to appropriate next level of care at discharge, Engage patient in therapeutic group addressing interpersonal concerns.  Short Term Goals: Engage patient in aftercare planning with referrals and resources, Increase social support, Facilitate acceptance of mental health diagnosis and concerns, Facilitate patient progression through stages of change regarding substance use diagnoses and concerns, Identify triggers associated with mental health/substance abuse issues, and Increase skills for wellness and recovery  Therapeutic Interventions: Assess for all discharge needs, 1 to 1 time with Social worker, Explore available resources and support systems, Assess for adequacy in community support network, Educate family and significant other(s) on suicide prevention, Complete Psychosocial Assessment,  Interpersonal group therapy.  Evaluation of Outcomes: Progressing   Progress in Treatment: Attending groups: No. Participating in groups: No. Taking medication as prescribed: Yes. Toleration medication: Yes. Family/Significant other contact made: Yes, individual(s) contacted:  If consents are provided Patient understands diagnosis: No. Discussing patient identified problems/goals with staff: Yes. Medical problems stabilized or resolved: Yes. Denies suicidal/homicidal ideation: Yes. Issues/concerns per patient self-inventory: No.   New problem(s) identified: No, Describe:  None   New Short Term/Long Term Goal(s): medication stabilization, elimination of SI thoughts, development of comprehensive mental wellness plan.   Patient Goals: "To get my mind right and to get housing"   Discharge Plan or Barriers: Patient recently admitted. CSW will continue to follow and assess for appropriate referrals and possible discharge planning.   Reason for Continuation of Hospitalization: Depression Hallucinations Medication stabilization Suicidal ideation Withdrawal symptoms  Estimated Length of Stay: 3 to 5 days   Attendees: Patient: Tristan Valdez  10/13/2020   Physician: Landry Mellow, MD 10/13/2020   Nursing:  10/13/2020   RN Care Manager: 10/13/2020   Social Worker: Melba Coon, LCSWA  10/13/2020   Recreational Therapist:  10/13/2020   Other:  10/13/2020   Other:  10/13/2020   Other: 10/13/2020     Scribe for Treatment Team: Felizardo Hoffmann, LCSWA 10/13/2020 10:47 AM

## 2020-10-13 NOTE — BHH Counselor (Signed)
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  He states that he will not participate in any residential treatment service that does not let him keep his current job.  He states that he attempted to contact [facility] and was not able to get an answer on Sunday. He states that he will try again today.  Tristan Valdez states that he has only contacted one shelter form his list of housing resource that the CSW provided for him last week.  He also states that he does not wake up until lunch time each day and does not wish to do anything or work on his discharge plan until that time. CSW will speak with Tristan Valdez in the afternoon to see if he has a more clear discharge plan.

## 2020-10-13 NOTE — Progress Notes (Signed)
Recreation Therapy Notes  Date:  6.20.22 Time: 0930 Location: [address] Dayroom   Group Topic: Stress Management   Goal Area(s) Addresses: Patient will identify positive stress management techniques. Patient will identify benefits of using stress management post d/c.   Intervention: Stress Management   Activity :  Meditation.  LRT played Valdez meditation that focused on taking note of any tension that may be in the body or any sensations that may be felt such as cool, warmth, tingles or any slight sensations on the body.   Education:  Stress Management, Discharge Planning.   Education Outcome: Acknowledges Education   Clinical Observations/Feedback: Pt did not attend group session.       Keymiah Lyles, LRT/CTRS         Tristan Valdez 10/13/2020 12:45 PM 

## 2020-10-13 NOTE — Progress Notes (Signed)
   10/12/20 2358  Clinical Opiate Withdrawal Scale (COWS)  Resting Pulse Rate 1  Sweating 0  Restlessness 0  Pupil Size 0  Bone or Joint Aches 0  Runny Nose or Tearing 0  GI Upset 0  Tremor 0  Yawning 0  Anxiety or Irritability 1  Gooseflesh Skin 0  COWS Total Score 2   Patient compliant with treatment interacting well with Peers and staff. This evening seems to have improved in mood and affect. Denies SI/HI/A/VH. Support and encouragement provided as needed.

## 2020-10-13 NOTE — Progress Notes (Signed)
[facility] MD Progress Note  10/13/2020 4:46 PM Tristan Valdez  MRN:  [id number]  Reason for admission: Tristan Valdez is a 50 year old male who was admitted to [facility] from [facility] after presenting there for suicidal ideations, auditory and  visual hallucinations. Patient has a history of substance misuse and states he recently relapsed on opiates, including heroin, fentanyl, methamphetamines.    Subjective:  "If I can get into [facility] and still have my job, I will be fine. I will call my boss tonight and make sure I still have my job."  Evaluation on the unit: Chart reviewed, patient's case discussed during treatment team. Patient seen and evaluated. Patient has been observed walking around the unit today and participating in groups with active discussion. Patient is alert and oriented, and is approached as he was walking in the hall. Patient states that he is not having suicidal thoughts today. Denies AVH. He is able to express plan for discharge, stating that he hopes to get into [facility] and is working with CSW toward that goal. He endorses adequate appetite and sleep is  "OK" he says. He says it takes him a while to go to sleep and he sleeps later in the day than he does at home. 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 Patient is taking meds as prescribed. He is off CIWA protocol.     Principal Problem: MDD (major depressive disorder), recurrent episode, severe (HCC) Diagnosis: Principal Problem:   MDD (major depressive disorder), recurrent episode, severe (HCC) Active Problems:   Polysubstance abuse (HCC)  Total Time spent with patient: 15 minutes  Past Psychiatric History:  Per H&P: "Patient stated he had 1 previous psychiatric admission.  He has been admitted for detox and also for substance rehabilitation in the past.  He apparently was at [facility] several months ago, and does not want to return to  that facility."  Past Medical History:  Past Medical History:  Diagnosis Date   Arthritis    Bilateral club feet    Hypertension     Past Surgical History:  Procedure Laterality Date   APPENDECTOMY     reconstruction of tendons of left arm Left    Family History: History reviewed. No pertinent family history.  Family Psychiatric  History: Per H&P: "Noncontributory"   Social History:  Social History   Substance and Sexual Activity  Alcohol Use Never     Social History   Substance and Sexual Activity  Drug Use Yes   Types: Methamphetamines   Comment: last use 3 days ago    Social History   Socioeconomic History   Marital status: Divorced    Spouse name: Not on file   Number of children: Not on file   Years of education: Not on file   Highest education level: Not on file  Occupational History   Not on file  Tobacco Use   Smoking status: Every Day    Packs/day: 1.00    Pack years: 0.00    Types: Cigarettes   Smokeless tobacco: Never   Tobacco comments:    Last used: 2 days ago  Vaping Use   Vaping Use: Some days  Substance and Sexual Activity   Alcohol use: Never   Drug use: Yes    Types: Methamphetamines    Comment: last use 3 days ago   Sexual activity: Not Currently  Other Topics Concern   Not on file  Social History Narrative   Not on file   Social Determinants of Health   Financial Resource Strain: Not on file  Food Insecurity: Not on file  Transportation Needs: Not on file  Physical Activity: Not on file  Stress: Not on file  Social Connections: Not on file   Additional Social History:   Sleep: Fair  Appetite:  Good  Current Medications: Current Facility-Administered Medications  Medication Dose Route Frequency Provider Last Rate Last Admin   acetaminophen (TYLENOL) tablet 650 mg  650 mg Oral Q6H PRN Antonieta Pert, MD   650 mg at 10/09/20 1620   alum & mag hydroxide-simeth (MAALOX/MYLANTA) 200-200-20 MG/5ML suspension 30 mL  30 mL  Oral Q4H PRN Antonieta Pert, MD       cloNIDine (CATAPRES) tablet 0.1 mg  0.1 mg Oral TID PRN Antonieta Pert, MD       magnesium hydroxide (MILK OF MAGNESIA) suspension 30 mL  30 mL Oral Daily PRN Antonieta Pert, MD       mirtazapine (REMERON) tablet 15 mg  15 mg Oral QHS Antonieta Pert, MD   15 mg at 10/12/20 2057   nicotine (NICODERM CQ - dosed in mg/24 hours) patch 21 mg  21 mg Transdermal Daily Antonieta Pert, MD   21 mg at 10/13/20 0915   risperiDONE (RISPERDAL) tablet 0.5 mg  0.5 mg Oral Daily Antonieta Pert, MD   0.5 mg at 10/13/20 0914   risperiDONE (RISPERDAL) tablet 1 mg  1 mg Oral QHS Antonieta Pert, MD   1 mg at 10/12/20 2057   traZODone (DESYREL) tablet 50 mg  50 mg Oral QHS PRN Jaclyn Shaggy, PA-C   50 mg at 10/11/20 2207    Lab Results:  Results for orders placed or performed during the hospital encounter of 10/07/20 (from the past 48 hour(s))  Lipid panel     Status: Abnormal   Collection Time: 10/11/20  6:28 PM  Result Value Ref Range   Cholesterol 150 0 - 200 mg/dL   Triglycerides 270 (H) <150 mg/dL   HDL 35 (L) >35 mg/dL   Total CHOL/HDL Ratio 4.3 RATIO   VLDL 35 0 - 40 mg/dL   LDL Cholesterol 80 0 - 99 mg/dL    Comment:        Total Cholesterol/HDL:CHD Risk Coronary Heart Disease Risk Table                     Men   Women  1/2 Average Risk   3.4   3.3  Average Risk       5.0   4.4  2 X Average Risk   9.6   7.1  3 X Average Risk  23.4   11.0        Use the calculated Patient Ratio above and the CHD Risk Table to determine the patient's CHD Risk.        ATP III CLASSIFICATION (LDL):  <100     mg/dL   Optimal  009-381  mg/dL   Near or Above                    Optimal  130-159  mg/dL   Borderline  829-937  mg/dL   High  >169     mg/dL   Very High Performed at [facility], [address], [place], [place] [address]   Hemoglobin  A1c     Status: Abnormal   Collection Time: 10/11/20  6:28 PM  Result Value Ref  Range   Hgb A1c MFr Bld 5.8 (H) 4.8 - 5.6 %    Comment: (NOTE)         Prediabetes: 5.7 - 6.4         Diabetes: >6.4         Glycemic control for adults with diabetes: <7.0    Mean Plasma Glucose 120 mg/dL    Comment: (NOTE) Performed At: [facility] [address] [place], [place] [id number] Jolene Schimke MD LY:[id number]   TSH     Status: None   Collection Time: 10/11/20  6:28 PM  Result Value Ref Range   TSH 1.096 0.350 - 4.500 uIU/mL    Comment: Performed by a 3rd Generation assay with a functional sensitivity of <=0.01 uIU/mL. Performed at [facility], [address], [place], [place] [address]     Blood Alcohol level:  Lab Results  Component Value Date   ETH <10 10/06/2020   ETH <10 08/19/2020    Metabolic Disorder Labs: Lab Results  Component Value Date   HGBA1C 5.8 (H) 10/11/2020   MPG 120 10/11/2020   No results found for: PROLACTIN Lab Results  Component Value Date   CHOL 150 10/11/2020   TRIG 173 (H) 10/11/2020   HDL 35 (L) 10/11/2020   CHOLHDL 4.3 10/11/2020   VLDL 35 10/11/2020   LDLCALC 80 10/11/2020    Physical Findings: AIMS: Facial and Oral Movements Muscles of Facial Expression: None, normal Lips and Perioral Area: None, normal Jaw: None, normal Tongue: None, normal,Extremity Movements Upper (arms, wrists, hands, fingers): None, normal Lower (legs, knees, ankles, toes): None, normal, Trunk Movements Neck, shoulders, hips: None, normal, Overall Severity Severity of abnormal movements (highest score from questions above): None, normal Incapacitation due to abnormal movements: None, normal Patient's awareness of abnormal movements (rate only patient's report): No Awareness, Dental Status Current problems with teeth and/or dentures?: No Does patient usually wear dentures?: No  CIWA:    COWS:  COWS Total Score: 2  Musculoskeletal: Strength & Muscle Tone: within normal limits Gait & Station: normal Patient  leans: N/A  Psychiatric Specialty Exam:  Presentation  General Appearance: Fairly Groomed; Appropriate for Environment  Eye Contact:Fair  Speech:Normal Rate  Speech Volume:Normal  Handedness:Right   Mood and Affect  Mood:Anxious  Affect:Congruent   Thought Process  Thought Processes:Coherent  Descriptions of Associations:Circumstantial  Orientation:Full (Time, Place and Person)  Thought Content:WDL; Logical  History of Schizophrenia/Schizoaffective disorder:No  Duration of Psychotic Symptoms:Less than six months  Hallucinations:Hallucinations: None (Denies) Ideas of Reference:None  Suicidal Thoughts:Suicidal Thoughts: No (Presently denies) Homicidal Thoughts:Homicidal Thoughts: No (Denies)  Sensorium  Memory:Immediate Good; Recent Good  Judgment:Poor  Insight:Lacking   Executive Functions  Concentration:Fair  Attention Span:Fair  Recall:Good  Fund of Knowledge:Good  Language:Good   Psychomotor Activity  Psychomotor Activity: Psychomotor Activity: Normal  Assets  Assets:Desire for Improvement; Resilience; Physical Health; Vocational/Educational   Sleep  Sleep: Sleep: Fair   Physical Exam: Physical Exam Vitals and nursing note reviewed.  HENT:     Head: Normocephalic.     Nose: No congestion or rhinorrhea.  Eyes:     General:        Right eye: No discharge.        Left eye: No discharge.  Pulmonary:     Effort: Pulmonary effort is normal.  Musculoskeletal:        General: Normal  range of motion.     Cervical back: Normal range of motion.  Neurological:     Mental Status: He is alert and oriented to person, place, and time.   Review of Systems  Psychiatric/Behavioral:  Positive for depression (improving). The patient is nervous/anxious (improving).   All other systems reviewed and are negative. Blood pressure 128/80, pulse 84, temperature 97.9 F (36.6 C), temperature source Oral, resp. rate 16, height 6\' 1"  (1.854 m),  weight 76.7 kg, SpO2 99 %. Body mass index is 22.3 kg/m.   Treatment Plan Summary: Daily contact with patient to assess and evaluate symptoms and progress in treatment and Medication management  10/13/2020 Update:   Continue inpatient hospitalization.    Diagnosis: 1.  Opiate withdrawal 2.  Opiate dependence 3.  Substance-induced mood disorder 4.  Substance-induced psychotic disorder   Pertinent findings on examination today: 1.  Stated mood is improved, denies suicidal ideation. Affect is improved.  2.  States sleep is fair.  3.  No evidence of acute withdrawal symptoms. Patient tolerating medications   Plan: 1.  Continue Catapres 0.1 mg p.o. 3 times daily as needed a systolic blood pressure greater than 150. 2. Discontinued opiate detox protocol including Bentyl, Imodium, Robaxin, Zofran and Naprosyn. 3.  Continue mirtazapine 15 mg p.o. nightly for depression and anxiety. 4.  Continue Risperdal 0.5 mg p.o. daily and 1 mg p.o. nightly for psychosis. 5.  Continue trazodone 50 mg p.o. nightly as needed insomnia. 6.  Disposition planning-in progress. Discussed with patient the need to be more flexible with regard to substance abuse residential facilities. Patient is [facility] get [facility]. Consider discharge tomorrow, 10/14/20.     Vanetta MuldersLouise F Chayil Gantt, NP, PMHNP-BC 10/13/2020, 4:46 PM

## 2020-10-13 NOTE — [facility] Group Notes (Signed)
Type of Therapy and Topic:  Group Therapy - Healthy vs Unhealthy Coping Skills  Participation Level:  Active   Description of Group The focus of this group was to determine what unhealthy coping techniques typically are used by group members and what healthy coping techniques would be helpful in coping with various problems. Patients were guided in becoming aware of the differences between healthy and unhealthy coping techniques. Patients were asked to identify 2-3 healthy coping skills they would like to learn to use more effectively.  Therapeutic Goals Patients learned that coping is what human beings do all day long to deal with various situations in their lives Patients defined and discussed healthy vs unhealthy coping techniques Patients identified their preferred coping techniques and identified whether these were healthy or unhealthy Patients determined 2-3 healthy coping skills they would like to become more familiar with and use more often. Patients provided support and ideas to each other   Summary of Patient Progress:  During group, Ramello expressed that riding his motorcycle is a coping skill for him. Patient proved open to input from peers and feedback from CSW. Patient demonstrated insight into the subject matter, was respectful of peers, and participated throughout the entire session.   Therapeutic Modalities Cognitive Behavioral Therapy Motivational Interviewing

## 2020-10-14 DIAGNOSIS — F1510 Other stimulant abuse, uncomplicated: Secondary | ICD-10-CM

## 2020-10-14 MED ORDER — MIRTAZAPINE 15 MG PO TABS: 15.0000 mg | ORAL_TABLET | Freq: Every day | ORAL | 0 refills | Status: DC

## 2020-10-14 MED ORDER — TRAZODONE HCL 50 MG PO TABS: 50.0000 mg | ORAL_TABLET | Freq: Every evening | ORAL | 0 refills | Status: DC | PRN

## 2020-10-14 MED ORDER — RISPERIDONE 1 MG PO TABS: 1.0000 mg | ORAL_TABLET | Freq: Every day | ORAL | 0 refills | Status: DC

## 2020-10-14 MED ORDER — RISPERIDONE 0.5 MG PO TABS: 0.5000 mg | ORAL_TABLET | Freq: Every day | ORAL | 0 refills | Status: DC

## 2020-10-14 NOTE — Progress Notes (Signed)
  D:  Pt presents with mild anxiety and depression today.  Pt observed in dayroom attending group, snack, tv time, and interacting with peers.  Pt denies SI/HI, but verbally contracts for safety should the thoughts arise.  Pt denies AVH.  A:  Labs/Vitals monitored; Medication education provided; Pt encouraged to communicate concerns.  R:  Pt remains safe on unit with Q 15 minute safety checks.  Will continue POC.    10/13/20 2135  Psych Admission Type (Psych Patients Only)  Admission Status Voluntary  Psychosocial Assessment  Patient Complaints Anxiety;Depression  Eye Contact Brief  Facial Expression Flat  Affect Appropriate to circumstance  Speech Logical/coherent  Interaction Guarded;Minimal;Isolative  Motor Activity Slow  Appearance/Hygiene In scrubs  Behavior Characteristics Cooperative  Mood Anxious  Thought Process  Coherency WDL  Content WDL  Delusions None reported or observed  Perception WDL  Hallucination None reported or observed  Judgment Poor  Confusion None  Danger to Self  Current suicidal ideation? Denies  Danger to Others  Danger to Others None reported or observed

## 2020-10-14 NOTE — Progress Notes (Signed)
  [facility] Adult Case Management Discharge Plan :  Will you be returning to the same living situation after discharge:  No, IRC At discharge, do you have transportation home?: Yes,  Safe Transport  Do you have the ability to pay for your medications: Yes,  Insurance   Release of information consent forms completed and in the chart;  Patient's signature needed at discharge.  Patient to Follow up at:  Follow-up Information     [facility] Follow up.   Why: This provider offers psychiatric medication management and brief psychotherapy.  Please call if you would like to schedule an appointment. Contact information: [address] [place], [place] [address]  P: [phone], [phone] F: [phone]        [facility]. Go to.   Specialty: Behavioral Health Why: You may go to this [facility] provider for therapy and medication management services during walk in hours:  Monday through Wednesday from 8:00 am to 11:00 am.  Services are provided on a first come, first served basis. Please ask about substance use services SAIOP. Contact information: [address] [place] [address] [phone]        [facility] Follow up.   Why: Please contact this provider to have labs drawn for your medication management and Potassium levels. Contact information: [address] [place] [address] [phone]                Next level of care provider has access to [facility] Link:yes  Safety Planning and Suicide Prevention discussed: Yes,  with patient      Has patient been referred to the Quitline?: Patient refused referral  Patient has been referred for addiction treatment: Yes  Aram Beecham, LCSWA 10/14/2020, 10:04 AM

## 2020-10-14 NOTE — BHH Suicide Risk Assessment (Signed)
[facility] INPATIENT:  Family/Significant Other Suicide Prevention Education  Suicide Prevention Education:  Contact Attempts: Raylene Everts [phone] Louann Sjogren) has been identified by the patient as the family member/significant other with whom the patient will be residing, and identified as the person(s) who will aid the patient in the event of a mental health crisis.  With written consent from the patient, two attempts were made to provide suicide prevention education, prior to and/or following the patient's discharge.  We were unsuccessful in providing suicide prevention education.  A suicide education pamphlet was given to the patient to share with family/significant other.  Date and time of first attempt: 10/13/20 at 9:30am  Date and time of second attempt:10/14/20 at 9:45am   Aram Beecham 10/14/2020, 9:53 AM

## 2020-10-14 NOTE — Discharge Summary (Signed)
Physician Discharge Summary Note  Patient:  Tristan Valdez is an 50 y.o., male MRN:  [id number] DOB:  06-22-1970 Patient phone:  [phone] (home)  Patient address:   [facility] [place],  Total Time spent with patient: 30 minutes  Date of Admission:  10/07/2020 Date of Discharge: 10/14/2020  Reason for Admission: Keni Elison is a 50 year old male with a past psychiatric history significant for polysubstance use disorders, anxiety, and depression, who was admitted to the [facility] in the context of substance induced mood disorder, presenting with SI and auditory hallucinations  Principal Problem: Substance induced mood disorder (HCC) Discharge Diagnoses: Principal Problem:   Substance induced mood disorder (HCC) Active Problems:   Amphetamine abuse (HCC)   Past Psychiatric History: Per H&P: "Patient stated he had 1 previous psychiatric admission.  He has been admitted for detox and also for substance rehabilitation in the past.  He apparently was at [facility] several months ago, and does not want to return to that facility"  Past Medical History:  Past Medical History:  Diagnosis Date   Arthritis    Bilateral club feet    Hypertension     Past Surgical History:  Procedure Laterality Date   APPENDECTOMY     reconstruction of tendons of left arm Left    Family History: History reviewed. No pertinent family history.  Family Psychiatric  History: Per H&P: Noncontributory Social History:  Social History   Substance and Sexual Activity  Alcohol Use Never     Social History   Substance and Sexual Activity  Drug Use Yes   Types: Methamphetamines   Comment: last use 3 days ago    Social History   Socioeconomic History   Marital status: Divorced    Spouse name: Not on file   Number of children: Not on file   Years of education: Not on file   Highest education level: Not on file  Occupational History   Not on file  Tobacco Use   Smoking status: Every Day     Packs/day: 1.00    Pack years: 0.00    Types: Cigarettes   Smokeless tobacco: Never   Tobacco comments:    Last used: 2 days ago  Vaping Use   Vaping Use: Some days  Substance and Sexual Activity   Alcohol use: Never   Drug use: Yes    Types: Methamphetamines    Comment: last use 3 days ago   Sexual activity: Not Currently  Other Topics Concern   Not on file  Social History Narrative   Not on file   Social Determinants of Health   Financial Resource Strain: Not on file  Food Insecurity: Not on file  Transportation Needs: Not on file  Physical Activity: Not on file  Stress: Not on file  Social Connections: Not on file    Hospital Course:  After the above admission evaluation, patient was admitted to the [facility] for medication management and treatment for his symptoms of  depression, suicidal ideation, and auditory hallucinations. Per patient, this is his second psychiatric admission. There were no instances of behavior that required restraints or immediate intervention. Patient remained safe on the unit. There were no instances of self-harming behavior. Patient initially did not participate in programming, but eventually participated in group sessions and interacted with staff and patients appropriately. He stated that he learned coping skills to alleviate his symptoms of depression and suicidal ideation.  Risperidone was initiated, 0.5 mg daily in the morning and 1 mg at bedtime. He received  Rermeron for depression, trazodone for sleep and depression. He was given prescriptions for all his [facility] medications at discharge.  At time of discharge, patient denied auditory or visual hallucinations. Over the course of his hospitalization, patient stated that her symptoms of anxiety and depression have improved. He denies thoughts of self-harm and suicidal ideation. He expresses that he has learned better coping strategies to alleviate her symptoms. Patient expresses readiness for discharge and  future-oriented thinking. Patient encouraged to keep all psychiatric appointments and continue with follow-up.      Physical Findings: AIMS: Facial and Oral Movements Muscles of Facial Expression: None, normal Lips and Perioral Area: None, normal Jaw: None, normal Tongue: None, normal,Extremity Movements Upper (arms, wrists, hands, fingers): None, normal Lower (legs, knees, ankles, toes): None, normal, Trunk Movements Neck, shoulders, hips: None, normal, Overall Severity Severity of abnormal movements (highest score from questions above): None, normal Incapacitation due to abnormal movements: None, normal Patient's awareness of abnormal movements (rate only patient's report): No Awareness, Dental Status Current problems with teeth and/or dentures?: No Does patient usually wear dentures?: No  CIWA:    COWS:  COWS Total Score: 1  Musculoskeletal: Strength & Muscle Tone: within normal limits Gait & Station: normal Patient leans: N/A   Psychiatric Specialty Exam:  SEE MD'S DISCHARGE SRA    Physical Exam: Physical Exam Vitals and nursing note reviewed.  HENT:     Head: Normocephalic.     Nose: No congestion or rhinorrhea.  Eyes:     General:        Right eye: No discharge.        Left eye: No discharge.  Pulmonary:     Effort: Pulmonary effort is normal.  Musculoskeletal:        General: Normal range of motion.     Cervical back: Normal range of motion.  Neurological:     Mental Status: He is alert and oriented to person, place, and time.   Review of Systems  Psychiatric/Behavioral:  Negative for depression (stable for discharge), hallucinations (stable for discharge), memory loss, substance abuse and suicidal ideas. The patient is not nervous/anxious and does not have insomnia.   Blood pressure 109/67, pulse (!) 111, temperature 97.9 F (36.6 C), temperature source Oral, resp. rate 16, height 6\' 1"  (1.854 m), weight 76.7 kg, SpO2 95 %. Body mass index is 22.3  kg/m.   Social History   Tobacco Use  Smoking Status Every Day   Packs/day: 1.00   Pack years: 0.00   Types: Cigarettes  Smokeless Tobacco Never  Tobacco Comments   Last used: 2 days ago   Tobacco Cessation:  FDA approved smoking cessation product that is available without  a prescription was recommended.     Blood Alcohol level:  Lab Results  Component Value Date   ETH <10 10/06/2020   ETH <10 08/19/2020    Metabolic Disorder Labs:  Lab Results  Component Value Date   HGBA1C 5.8 (H) 10/11/2020   MPG 120 10/11/2020   No results found for: PROLACTIN Lab Results  Component Value Date   CHOL 150 10/11/2020   TRIG 173 (H) 10/11/2020   HDL 35 (L) 10/11/2020   CHOLHDL 4.3 10/11/2020   VLDL 35 10/11/2020   LDLCALC 80 10/11/2020    See Psychiatric Specialty Exam and Suicide Risk Assessment completed by Attending Physician prior to discharge.  Discharge destination:  Home  Is patient on multiple antipsychotic therapies at discharge:  No   Has Patient had three or more  failed trials of antipsychotic monotherapy by history:  No  Recommended Plan for Multiple Antipsychotic Therapies: NA   Allergies as of 10/14/2020   No Known Allergies      Medication List     TAKE these medications      Indication  mirtazapine 15 MG tablet Commonly known as: REMERON Take 1 tablet (15 mg total) by mouth at bedtime.  Indication: Major Depressive Disorder   risperiDONE 1 MG tablet Commonly known as: RISPERDAL Take 1 tablet (1 mg total) by mouth at bedtime.  Indication: auditroy hallucinations   risperiDONE 0.5 MG tablet Commonly known as: RISPERDAL Take 1 tablet (0.5 mg total) by mouth daily. Start taking on: October 15, 2020  Indication: auditory hallucinations   traZODone 50 MG tablet Commonly known as: DESYREL Take 1 tablet (50 mg total) by mouth at bedtime as needed for sleep.  Indication: Trouble Sleeping        Follow-up Information     [facility] Follow up.   Why: This provider offers psychiatric medication management and brief psychotherapy.  Please call if you would like to schedule an appointment. Contact information: [address] [place], [place] [address]  P: [phone], [phone] F: [phone]        [facility]. Go to.   Specialty: Behavioral Health Why: You may go to this [facility] provider for therapy and medication management services during walk in hours:  Monday through Wednesday from 8:00 am to 11:00 am.  Services are provided on a first come, first served basis. Please ask about substance use services SAIOP. Contact information: [address] [place] [address] [phone]        [facility] Follow up.   Why: Please contact this provider to have labs drawn for your medication management and Potassium levels. Contact information: [address] [place] [address] [phone]                Follow-up recommendations:  Follow up with outpatient provider for all your medical care needs. Activity as tolerated. Diet as recommended by your outpatient provider.   Comments:  Prescriptions given at discharge. Patient agreeable to plan.  Given opportunity to ask questions.  Appears to feel comfortable with discharge denies any current suicidal or homicidal thought. Denies auditory or visual hallucinations Patient is also instructed prior to discharge to take all medications as prescribed by his mental healthcare provider. Report any adverse effects and or reactions from the medicines to his outpatient provider promptly. Patient has been instructed & cautioned to refrain from using alcohol and or illegal drugs. In the event of worsening symptoms, patient is instructed to call the mobile crisis hotline [phone], National Suicide prevention Lifeline [phone], 911 and/or go to the nearest ED  for appropriate evaluation and treatment of symptoms. To follow-up with his primary care provider for your other medical issues, concerns and or health care needs.   Signed: Vanetta Mulders, NP, PMHNP-BC 10/14/2020, 10:14 AM
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
## 2020-10-14 NOTE — Progress Notes (Signed)
Discharge Note:  Patient denies SI/HI AVH at this time. Discharge instructions, AVS, prescriptions and transition record gone over with patient. Patient agrees to comply with medication management, follow-up visit, and outpatient therapy. Patient belongings returned to patient. Patient questions and concerns addressed and answered.  Patient ambulatory off unit.  Patient discharged to home.   

## 2020-10-14 NOTE — Progress Notes (Signed)
   Pt BP of (148/104) sitting recorded at 2033.  Pt has PRN Clonidine for SBP>150; Clonidine administration parameters not met.  Hydrated patient.  Pt BP was rechecked at 0147, BP (109/67) standing.

## 2020-10-14 NOTE — BHH Group Notes (Signed)
Patient did not attend group   ADULT GRIEF GROUP NOTE:   Spiritual care group on grief and loss facilitated by chaplain Katy Demarko Zeimet, BCC   Group Goal:   Support / Education around grief and loss   Members engage in facilitated group support and psycho-social education.   Group Description:   Following introductions and group rules, group members engaged in facilitated group dialog and support around topic of loss, with particular support around experiences of loss in their lives. Group Identified types of loss (relationships / self / things) and identified patterns, circumstances, and changes that precipitate losses. Reflected on thoughts / feelings around loss, normalized grief responses, and recognized variety in grief experience. Group noted Worden's four tasks of grief in discussion.   Group drew on Adlerian / Rogerian, narrative, MI,    

## 2020-10-14 NOTE — Plan of Care (Signed)
  Problem: Education: Goal: Emotional status will improve Outcome: Progressing   Problem: Activity: Goal: Interest or engagement in activities will improve Outcome: Progressing   Problem: Health Behavior/Discharge Planning: Goal: Compliance with treatment plan for underlying cause of condition will improve Outcome: Progressing   

## 2020-10-21 ENCOUNTER — Other Ambulatory Visit: Payer: Self-pay

## 2020-10-21 ENCOUNTER — Emergency Department (EMERGENCY_DEPARTMENT_HOSPITAL)
Admission: EM | Admit: 2020-10-21 | Discharge: 2020-10-22 | Disposition: A | Discharge status: Home / Self Care | Payer: No Typology Code available for payment source | Source: Home / Self Care | Attending: Emergency Medicine | Admitting: Emergency Medicine

## 2020-10-21 ENCOUNTER — Emergency Department (HOSPITAL_COMMUNITY)
Admission: EM | Admit: 2020-10-21 | Discharge: 2020-10-21 | Disposition: A | Discharge status: Home / Self Care | Payer: No Typology Code available for payment source | Attending: Emergency Medicine | Admitting: Emergency Medicine

## 2020-10-21 ENCOUNTER — Encounter (HOSPITAL_COMMUNITY): Payer: Self-pay | Admitting: Emergency Medicine

## 2020-10-21 ENCOUNTER — Emergency Department (HOSPITAL_COMMUNITY): Payer: No Typology Code available for payment source

## 2020-10-21 DIAGNOSIS — R45851 Suicidal ideations: Secondary | ICD-10-CM

## 2020-10-21 DIAGNOSIS — Z20822 Contact with and (suspected) exposure to covid-19: Secondary | ICD-10-CM | POA: Insufficient documentation

## 2020-10-21 DIAGNOSIS — F1910 Other psychoactive substance abuse, uncomplicated: Secondary | ICD-10-CM | POA: Insufficient documentation

## 2020-10-21 DIAGNOSIS — F1994 Other psychoactive substance use, unspecified with psychoactive substance-induced mood disorder: Secondary | ICD-10-CM | POA: Diagnosis not present

## 2020-10-21 DIAGNOSIS — R6884 Jaw pain: Secondary | ICD-10-CM | POA: Diagnosis not present

## 2020-10-21 DIAGNOSIS — I10 Essential (primary) hypertension: Secondary | ICD-10-CM | POA: Insufficient documentation

## 2020-10-21 DIAGNOSIS — F17210 Nicotine dependence, cigarettes, uncomplicated: Secondary | ICD-10-CM | POA: Insufficient documentation

## 2020-10-21 DIAGNOSIS — F332 Major depressive disorder, recurrent severe without psychotic features: Secondary | ICD-10-CM | POA: Insufficient documentation

## 2020-10-21 DIAGNOSIS — M79605 Pain in left leg: Secondary | ICD-10-CM | POA: Insufficient documentation

## 2020-10-21 DIAGNOSIS — F1914 Other psychoactive substance abuse with psychoactive substance-induced mood disorder: Secondary | ICD-10-CM | POA: Insufficient documentation

## 2020-10-21 DIAGNOSIS — F1510 Other stimulant abuse, uncomplicated: Secondary | ICD-10-CM | POA: Insufficient documentation

## 2020-10-21 DIAGNOSIS — R55 Syncope and collapse: Secondary | ICD-10-CM | POA: Diagnosis not present

## 2020-10-21 DIAGNOSIS — E876 Hypokalemia: Secondary | ICD-10-CM

## 2020-10-21 DIAGNOSIS — M25511 Pain in right shoulder: Secondary | ICD-10-CM | POA: Diagnosis not present

## 2020-10-21 DIAGNOSIS — R52 Pain, unspecified: Secondary | ICD-10-CM

## 2020-10-21 LAB — COMPREHENSIVE METABOLIC PANEL
ALT: 34 U/L (ref 0–44)
AST: 26 U/L (ref 15–41)
Albumin: 3.4 g/dL — ABNORMAL LOW (ref 3.5–5.0)
Alkaline Phosphatase: 57 U/L (ref 38–126)
Anion gap: 6 (ref 5–15)
BUN: 14 mg/dL (ref 6–20)
CO2: 30 mmol/L (ref 22–32)
Calcium: 8.9 mg/dL (ref 8.9–10.3)
Chloride: 101 mmol/L (ref 98–111)
Creatinine, Ser: 0.99 mg/dL (ref 0.61–1.24)
GFR, Estimated: >60 mL/min (ref >60)
Glucose, Bld: 112 mg/dL — ABNORMAL HIGH (ref 70–99)
Potassium: 3.4 mmol/L — ABNORMAL LOW (ref 3.5–5.1)
Sodium: 137 mmol/L (ref 135–145)
Total Bilirubin: 1.2 mg/dL (ref 0.3–1.2)
Total Protein: 5.9 g/dL — ABNORMAL LOW (ref 6.5–8.1)

## 2020-10-21 LAB — CBC
HCT: 42.5 % (ref 39.0–52.0)
Hemoglobin: 13.9 g/dL (ref 13.0–17.0)
MCH: 30.6 pg (ref 26.0–34.0)
MCHC: 32.7 g/dL (ref 30.0–36.0)
MCV: 93.6 fL (ref 80.0–100.0)
Platelets: 183 10*3/uL (ref 150–400)
RBC: 4.54 MIL/uL (ref 4.22–5.81)
RDW: 13.1 % (ref 11.5–15.5)
WBC: 8.2 10*3/uL (ref 4.0–10.5)
nRBC: 0 % (ref 0.0–0.2)

## 2020-10-21 LAB — RAPID URINE DRUG SCREEN, HOSP PERFORMED
Amphetamines: POSITIVE — AB
Barbiturates: NOT DETECTED
Benzodiazepines: NOT DETECTED
Cocaine: NOT DETECTED
Opiates: NOT DETECTED
Tetrahydrocannabinol: NOT DETECTED

## 2020-10-21 LAB — RESP PANEL BY RT-PCR (FLU A&B, COVID) ARPGX2
Influenza A by PCR: NEGATIVE
Influenza B by PCR: NEGATIVE
SARS Coronavirus 2 by RT PCR: NEGATIVE

## 2020-10-21 LAB — SALICYLATE LEVEL: Salicylate Lvl: <7 mg/dL — ABNORMAL LOW (ref 7.0–30.0)

## 2020-10-21 LAB — ACETAMINOPHEN LEVEL: Acetaminophen (Tylenol), Serum: <10 ug/mL — ABNORMAL LOW (ref 10–30)

## 2020-10-21 LAB — ETHANOL: Alcohol, Ethyl (B): <10 mg/dL (ref <10)

## 2020-10-21 MED ORDER — ACETAMINOPHEN 325 MG PO TABS
650.0000 mg | ORAL_TABLET | Freq: Once | ORAL | Status: AC
  Administered 2020-10-21: 650 mg via ORAL
  Filled 2020-10-21: qty 2

## 2020-10-21 MED ORDER — TRAZODONE HCL 50 MG PO TABS
50.0000 mg | ORAL_TABLET | Freq: Every evening | ORAL | Status: DC | PRN
Start: 2020-10-21 — End: 2020-10-22

## 2020-10-21 MED ORDER — MIRTAZAPINE 15 MG PO TABS
15.0000 mg | ORAL_TABLET | Freq: Every day | ORAL | Status: DC
  Filled 2020-10-21 (×2): qty 1

## 2020-10-21 MED ORDER — POTASSIUM CHLORIDE CRYS ER 20 MEQ PO TBCR
40.0000 meq | EXTENDED_RELEASE_TABLET | Freq: Once | ORAL | Status: AC
  Administered 2020-10-22: 40 meq via ORAL
  Filled 2020-10-21: qty 2

## 2020-10-21 MED ORDER — NICOTINE 14 MG/24HR TD PT24: 14.0000 mg | MEDICATED_PATCH | Freq: Every day | TRANSDERMAL | Status: DC

## 2020-10-21 MED ORDER — RISPERIDONE 0.5 MG PO TABS
0.5000 mg | ORAL_TABLET | Freq: Every day | ORAL | Status: DC
  Administered 2020-10-22: 0.5 mg via ORAL
  Filled 2020-10-21: qty 1

## 2020-10-21 MED ORDER — ACETAMINOPHEN 325 MG PO TABS: 650.0000 mg | ORAL_TABLET | Freq: Four times a day (QID) | ORAL | Status: DC | PRN

## 2020-10-21 MED ORDER — ALUM & MAG HYDROXIDE-SIMETH 200-200-20 MG/5ML PO SUSP: 30.0000 mL | ORAL | Status: DC | PRN

## 2020-10-21 MED ORDER — ONDANSETRON 4 MG PO TBDP: 4.0000 mg | ORAL_TABLET | Freq: Three times a day (TID) | ORAL | Status: DC | PRN

## 2020-10-21 MED ORDER — RISPERIDONE 1 MG PO TABS
1.0000 mg | ORAL_TABLET | Freq: Every day | ORAL | Status: DC
  Filled 2020-10-21 (×2): qty 1

## 2020-10-21 NOTE — ED Provider Notes (Signed)
[facility] EMERGENCY DEPARTMENT Provider Note   CSN: [id number] Arrival date & time: 10/21/20  1748     History Chief complaint: This suicidal ideation  Tristan Valdez is a 50 y.o. male.  The history is provided by the patient.  He has a history of substance substance abuse mood disorder, hypertension and comes in with suicidal ideation.  He states that for the last several days, he has had suicidal thoughts of jumping in front of a train.  He states that he was trying to get his life back together and was not making any progress and this is what made him feel like he wanted to kill himself.  He admits to crying spells, early morning awakening, anhedonia.  He has had hallucinations in the past, but none recently.  He does have history of abusing methamphetamine and fentanyl, last use was 24 hours ago.  He denies ethanol use.   Past Medical History:  Diagnosis Date   Arthritis    Bilateral club feet    Hypertension     Patient Active Problem List   Diagnosis Date Noted   Amphetamine abuse (HCC) 10/14/2020   Suicidal ideations 10/07/2020   Substance induced mood disorder (HCC) 10/07/2020   Polysubstance abuse (HCC)    TOBACCO DEPENDENCE 06/23/2006   GASTROESOPHAGEAL REFLUX, NO ESOPHAGITIS 06/23/2006   PSORIASIS 06/23/2006    Past Surgical History:  Procedure Laterality Date   APPENDECTOMY     reconstruction of tendons of left arm Left        No family history on file.  Social History   Tobacco Use   Smoking status: Every Day    Packs/day: 1.00    Pack years: 0.00    Types: Cigarettes   Smokeless tobacco: Never   Tobacco comments:    Last used: 2 days ago  Vaping Use   Vaping Use: Some days  Substance Use Topics   Alcohol use: Never   Drug use: Yes    Types: Methamphetamines    Comment: Fentanyl    Home Medications Prior to Admission medications   Medication Sig Start Date End Date Taking? Authorizing Provider  mirtazapine (REMERON)  15 MG tablet Take 1 tablet (15 mg total) by mouth at bedtime. 10/14/20   Vanetta Mulders, NP  risperiDONE (RISPERDAL) 0.5 MG tablet Take 1 tablet (0.5 mg total) by mouth daily. 10/15/20   Vanetta Mulders, NP  risperiDONE (RISPERDAL) 1 MG tablet Take 1 tablet (1 mg total) by mouth at bedtime. 10/14/20   Vanetta Mulders, NP  traZODone (DESYREL) 50 MG tablet Take 1 tablet (50 mg total) by mouth at bedtime as needed for sleep. 10/14/20   Vanetta Mulders, NP    Allergies    Patient has no known allergies.  Review of Systems   Review of Systems  All other systems reviewed and are negative.  Physical Exam Updated Vital Signs BP 119/61   Pulse 72   Temp 98.6 F (37 C)   Resp 20   SpO2 100%   Physical Exam Vitals and nursing note reviewed.  50 year old male, resting comfortably and in no acute distress. Vital signs are normal. Oxygen saturation is 100%, which is normal. Head is normocephalic and atraumatic. PERRLA, EOMI. Oropharynx is clear. Neck is nontender and supple without adenopathy or JVD. Back is nontender and there is no CVA tenderness. Lungs are clear without rales, wheezes, or rhonchi. Chest is nontender. Heart has regular rate and rhythm without murmur. Abdomen  is soft, flat, nontender without masses or hepatosplenomegaly and peristalsis is normoactive. Extremities have no cyanosis or edema, full range of motion is present. Skin is warm and dry without rash. Neurologic: Mental status is normal, cranial nerves are intact, there are no motor or sensory deficits.  ED Results / Procedures / Treatments   Labs (all labs ordered are listed, but only abnormal results are displayed) Labs Reviewed  COMPREHENSIVE METABOLIC PANEL - Abnormal; Notable for the following components:      Result Value   Potassium 3.4 (*)    Glucose, Bld 112 (*)    Total Protein 5.9 (*)    Albumin 3.4 (*)    All other components within normal limits  SALICYLATE LEVEL - Abnormal; Notable for  the following components:   Salicylate Lvl <7.0 (*)    All other components within normal limits  ACETAMINOPHEN LEVEL - Abnormal; Notable for the following components:   Acetaminophen (Tylenol), Serum <10 (*)    All other components within normal limits  RAPID URINE DRUG SCREEN, HOSP PERFORMED - Abnormal; Notable for the following components:   Amphetamines POSITIVE (*)    All other components within normal limits  RESP PANEL BY RT-PCR (FLU A&B, COVID) ARPGX2  ETHANOL  CBC   Procedures Procedures   Medications Ordered in ED Medications  potassium chloride SA (KLOR-CON) CR tablet 40 mEq (has no administration in time range)  mirtazapine (REMERON) tablet 15 mg (has no administration in time range)  risperiDONE (RISPERDAL) tablet 0.5 mg (has no administration in time range)  risperiDONE (RISPERDAL) tablet 1 mg (has no administration in time range)  traZODone (DESYREL) tablet 50 mg (has no administration in time range)  acetaminophen (TYLENOL) tablet 650 mg (has no administration in time range)  ondansetron (ZOFRAN-ODT) disintegrating tablet 4 mg (has no administration in time range)  alum & mag hydroxide-simeth (MAALOX/MYLANTA) 200-200-20 MG/5ML suspension 30 mL (has no administration in time range)  nicotine (NICODERM CQ - dosed in mg/24 hours) patch 14 mg (has no administration in time range)    ED Course  I have reviewed the triage vital signs and the nursing notes.  Pertinent lab results that were available during my care of the patient were reviewed by me and considered in my medical decision making (see chart for details).   MDM Rules/Calculators/A&P                         Depression with suicidal ideation.  Labs are significant for mild hypokalemia, he is given a dose of oral potassium.  Drug screen is positive for amphetamines, consistent with his history.  TTS consultation has been requested.  Old records were reviewed, and he has several recent ED visits for depression and  suicidal ideation.  TTS consultation is appreciated.  Patient meets inpatient criteria.  TTS is working on appropriate placement.  Final Clinical Impression(s) / ED Diagnoses Final diagnoses:  Suicidal ideation  Hypokalemia  Amphetamine abuse [facility])    Rx / DC Orders ED Discharge Orders     None        Dione Booze, MD 10/21/20 2351
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
## 2020-10-21 NOTE — BH Assessment (Signed)
TTS spoke to Cogan RN, to put Pt in a private room to complete assessment.  Clinician to call the cart in ten minutes.
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
## 2020-10-21 NOTE — ED Notes (Signed)
Patient transported to CT 

## 2020-10-21 NOTE — ED Notes (Signed)
Pt resting at this time. Visible chest rise and fall noted. Pt appears in NAD.  

## 2020-10-21 NOTE — ED Provider Notes (Signed)
Emergency Medicine Provider Triage Evaluation Note  Tristan Valdez , a 50 y.o. male  was evaluated in triage.  Pt complains of si, hi and avh. Has plan to walk out in front of a train. He also has a plan to give someone a leaf of fentanyl to kill them. Denies medical complaints. Hx polysubstance use  Review of Systems  Positive: Si, hi , avh Negative: fever  Physical Exam  BP 119/61   Pulse 72   Temp 98.6 F (37 C)   Resp 20   SpO2 100%  Gen:   Awake, no distress   Resp:  Normal effort  MSK:   Moves extremities without difficulty  Other:  Si, hi , avh  Medical Decision Making  Medically screening exam initiated at 6:39 PM.  Appropriate orders placed.  [name] was informed that the remainder of the evaluation will be completed by another provider, this initial triage assessment does not replace that evaluation, and the importance of remaining in the ED until their evaluation is complete.     Rayne Du 10/21/20 1840    Gerhard Munch, MD 10/21/20 2330
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
## 2020-10-21 NOTE — ED Notes (Signed)
This RN found pts belongings not inventoried in purple zone locker #7. Cell phone found in belongings, given to security.

## 2020-10-21 NOTE — ED Notes (Signed)
Pt speaking with TTS in yellow zone room 46.

## 2020-10-22 ENCOUNTER — Encounter (HOSPITAL_COMMUNITY): Payer: Self-pay | Admitting: Registered Nurse

## 2020-10-22 DIAGNOSIS — F1994 Other psychoactive substance use, unspecified with psychoactive substance-induced mood disorder: Secondary | ICD-10-CM

## 2020-10-22 DIAGNOSIS — R45851 Suicidal ideations: Secondary | ICD-10-CM

## 2020-10-22 DIAGNOSIS — F1910 Other psychoactive substance abuse, uncomplicated: Secondary | ICD-10-CM

## 2020-10-22 DIAGNOSIS — F1510 Other stimulant abuse, uncomplicated: Secondary | ICD-10-CM

## 2020-10-22 NOTE — Consult Note (Signed)
Telepsych Consultation   Reason for Consult:  Suicidal ideation Referring Physician:  Rayne Du Location of Patient: [facility] ED Location of Provider: Other: [facility]  Patient Identification: Shayaan Parke MRN:  [id number] Principal Diagnosis: Substance induced mood disorder (HCC) Diagnosis:  Principal Problem:   Substance induced mood disorder (HCC) Active Problems:   Suicidal ideations   Polysubstance abuse (HCC)   Amphetamine abuse (HCC)   Total Time spent with patient: 30 minutes  Subjective:   Wilho Sharpley is a 50 y.o. male patient admitted to [facility] ED after presenting with complaints of suicidal ideation  HPI:  Sosaia Pittinger, 50 y.o., male patient seen via tele health by this provider, consulted with Dr. Nelly Rout; and chart reviewed on 10/22/20.  On evaluation Lucifer Soja reports he is feeling better today.  Patient states he came to hospital "I talked to a friend and they helped me get set up with rehab and outpatient services but I didn't have the transportation so I started having thoughts of hurting myself so I brought myself to the hospital."  Patient states he is feeling better this morning.  Stating his main stressor is his substance use and getting help for it. Patient is able to contract for safety this morning denying suicidal/homicidal ideation, psychosis, and paranoia; stating none since yesterday.   During evaluation Zakai Gonyea is sitting up in bed in no acute distress.  He is alert, oriented x 4, calm and cooperative.  His mood is euthymic with congruent affect.  He does not appear to be responding to internal/external stimuli or delusional thoughts.  Patient denies suicidal/self-harm/homicidal ideation, psychosis, and paranoia.  Patient answered question appropriately.   Past Psychiatric History: See above  Risk to Self:  No Risk to Others:  No Prior Inpatient Therapy:  Yes Prior Outpatient Therapy:  Yes  Past Medical History:  Past  Medical History:  Diagnosis Date   Arthritis    Bilateral club feet    Hypertension     Past Surgical History:  Procedure Laterality Date   APPENDECTOMY     reconstruction of tendons of left arm Left    Family History: History reviewed. No pertinent family history. Family Psychiatric  History: None noted Social History:  Social History   Substance and Sexual Activity  Alcohol Use Never     Social History   Substance and Sexual Activity  Drug Use Yes   Types: Methamphetamines   Comment: Fentanyl    Social History   Socioeconomic History   Marital status: Divorced    Spouse name: Not on file   Number of children: Not on file   Years of education: Not on file   Highest education level: Not on file  Occupational History   Not on file  Tobacco Use   Smoking status: Every Day    Packs/day: 1.00    Pack years: 0.00    Types: Cigarettes   Smokeless tobacco: Never   Tobacco comments:    Last used: 2 days ago  Vaping Use   Vaping Use: Some days  Substance and Sexual Activity   Alcohol use: Never   Drug use: Yes    Types: Methamphetamines    Comment: Fentanyl   Sexual activity: Not Currently  Other Topics Concern   Not on file  Social History Narrative   Not on file   Social Determinants of Health   Financial Resource Strain: Not on file  Food Insecurity: Not on file  Transportation Needs: Not on file  Physical Activity: Not on file  Stress: Not on file  Social Connections: Not on file   Additional Social History:    Allergies:  No Known Allergies  Labs:  Results for orders placed or performed during the hospital encounter of 10/21/20 (from the past 48 hour(s))  Comprehensive metabolic panel     Status: Abnormal   Collection Time: 10/21/20  6:41 PM  Result Value Ref Range   Sodium 137 135 - 145 mmol/L   Potassium 3.4 (L) 3.5 - 5.1 mmol/L   Chloride 101 98 - 111 mmol/L   CO2 30 22 - 32 mmol/L   Glucose, Bld 112 (H) 70 - 99 mg/dL    Comment: Glucose  reference range applies only to samples taken after fasting for at least 8 hours.   BUN 14 6 - 20 mg/dL   Creatinine, Ser 2.75 0.61 - 1.24 mg/dL   Calcium 8.9 8.9 - 17.0 mg/dL   Total Protein 5.9 (L) 6.5 - 8.1 g/dL   Albumin 3.4 (L) 3.5 - 5.0 g/dL   AST 26 15 - 41 U/L   ALT 34 0 - 44 U/L   Alkaline Phosphatase 57 38 - 126 U/L   Total Bilirubin 1.2 0.3 - 1.2 mg/dL   GFR, Estimated >01 >74 mL/min    Comment: (NOTE) Calculated using the CKD-EPI Creatinine Equation (2021)    Anion gap 6 5 - 15    Comment: Performed at [facility] Lab, [address], [place], [place] [address]  Ethanol     Status: None   Collection Time: 10/21/20  6:41 PM  Result Value Ref Range   Alcohol, Ethyl (B) <10 <10 mg/dL    Comment: (NOTE) Lowest detectable limit for serum alcohol is 10 mg/dL.  For medical purposes only. Performed at [facility], [address], [place], [place] [address]   Salicylate level     Status: Abnormal   Collection Time: 10/21/20  6:41 PM  Result Value Ref Range   Salicylate Lvl <7.0 (L) 7.0 - 30.0 mg/dL    Comment: Performed at [facility] Lab, [address], [place], [place] [address]  Acetaminophen level     Status: Abnormal   Collection Time: 10/21/20  6:41 PM  Result Value Ref Range   Acetaminophen (Tylenol), Serum <10 (L) 10 - 30 ug/mL    Comment: (NOTE) Therapeutic concentrations vary significantly. A range of 10-30 ug/mL  may be an effective concentration for many patients. However, some  are best treated at concentrations outside of this range. Acetaminophen concentrations >150 ug/mL at 4 hours after ingestion  and >50 ug/mL at 12 hours after ingestion are often associated with  toxic reactions.  Performed at [facility], [address] [place], [place] [address]   cbc     Status: None   Collection Time: 10/21/20  6:41 PM  Result Value Ref Range   WBC 8.2 4.0 - 10.5 K/uL   RBC 4.54 4.22 - 5.81 MIL/uL   Hemoglobin 13.9 13.0 - 17.0 g/dL    HCT 70.1 77.9 - 39.0 %   MCV 93.6 80.0 - 100.0 fL   MCH 30.6 26.0 - 34.0 pg   MCHC 32.7 30.0 - 36.0 g/dL   RDW 30.0 92.3 - 30.0 %   Platelets 183 150 - 400 K/uL   nRBC 0.0 0.0 - 0.2 %    Comment: Performed at [facility], [address] [place], [place] [address]  Resp Panel by  RT-PCR (Flu A&B, Covid) Nasopharyngeal Swab     Status: None   Collection Time: 10/21/20  6:43 PM   Specimen: Nasopharyngeal Swab; Nasopharyngeal(NP) swabs in vial transport medium  Result Value Ref Range   SARS Coronavirus 2 by RT PCR NEGATIVE NEGATIVE    Comment: (NOTE) SARS-CoV-2 target nucleic acids are NOT DETECTED.  The SARS-CoV-2 RNA is generally detectable in upper respiratory specimens during the acute phase of infection. The lowest concentration of SARS-CoV-2 viral copies this assay can detect is 138 copies/mL. A negative result does not preclude SARS-Cov-2 infection and should not be used as the sole basis for treatment or other patient management decisions. A negative result may occur with  improper specimen collection/handling, submission of specimen other than nasopharyngeal swab, presence of viral mutation(s) within the areas targeted by this assay, and inadequate number of viral copies(<138 copies/mL). A negative result must be combined with clinical observations, patient history, and epidemiological information. The expected result is Negative.  Fact Sheet for Patients:  BloggerCourse.comhttps://www.fda.gov/media/152166/download  Fact Sheet for Healthcare Providers:  SeriousBroker.ithttps://www.fda.gov/media/152162/download  This test is no t yet approved or cleared by the [place] FDA and  has been authorized for detection and/or diagnosis of SARS-CoV-2 by FDA under an Emergency Use Authorization (EUA). This EUA will remain  in effect (meaning this test can be used) for the duration of the COVID-19 declaration under Section 564(b)(1) of the Act, 21 U.S.C.section 360bbb-3(b)(1), unless the authorization is  terminated  or revoked sooner.       Influenza A by PCR NEGATIVE NEGATIVE   Influenza B by PCR NEGATIVE NEGATIVE    Comment: (NOTE) The Xpert Xpress SARS-CoV-2/FLU/RSV plus assay is intended as an aid in the diagnosis of influenza from Nasopharyngeal swab specimens and should not be used as a sole basis for treatment. Nasal washings and aspirates are unacceptable for Xpert Xpress SARS-CoV-2/FLU/RSV testing.  Fact Sheet for Patients: BloggerCourse.comhttps://www.fda.gov/media/152166/download  Fact Sheet for Healthcare Providers: SeriousBroker.ithttps://www.fda.gov/media/152162/download  This test is not yet approved or cleared by the [place] FDA and has been authorized for detection and/or diagnosis of SARS-CoV-2 by FDA under an Emergency Use Authorization (EUA). This EUA will remain in effect (meaning this test can be used) for the duration of the COVID-19 declaration under Section 564(b)(1) of the Act, 21 U.S.C. section 360bbb-3(b)(1), unless the authorization is terminated or revoked.  Performed at [facility], [address] [place], [place] [address]   Rapid urine drug screen (hospital performed)     Status: Abnormal   Collection Time: 10/21/20  9:50 PM  Result Value Ref Range   Opiates NONE DETECTED NONE DETECTED   Cocaine NONE DETECTED NONE DETECTED   Benzodiazepines NONE DETECTED NONE DETECTED   Amphetamines POSITIVE (A) NONE DETECTED   Tetrahydrocannabinol NONE DETECTED NONE DETECTED   Barbiturates NONE DETECTED NONE DETECTED    Comment: (NOTE) DRUG SCREEN FOR MEDICAL PURPOSES ONLY.  IF CONFIRMATION IS NEEDED FOR ANY PURPOSE, NOTIFY LAB WITHIN 5 DAYS.  LOWEST DETECTABLE LIMITS FOR URINE DRUG SCREEN Drug Class                     Cutoff (ng/mL) Amphetamine and metabolites    1000 Barbiturate and metabolites    200 Benzodiazepine                 200 Tricyclics and metabolites     300 Opiates and metabolites        300 Cocaine and metabolites  300 THC                             50 Performed at [facility] Lab, [address] [place], [place] [address]     Medications:  Current Facility-Administered Medications  Medication Dose Route Frequency Provider Last Rate Last Admin   acetaminophen (TYLENOL) tablet 650 mg  650 mg Oral Q6H PRN Dione Booze, MD       alum & mag hydroxide-simeth (MAALOX/MYLANTA) 200-200-20 MG/5ML suspension 30 mL  30 mL Oral Q4H PRN Dione Booze, MD       mirtazapine (REMERON) tablet 15 mg  15 mg Oral QHS Dione Booze, MD       nicotine (NICODERM CQ - dosed in mg/24 hours) patch 14 mg  14 mg Transdermal Daily Dione Booze, MD       ondansetron (ZOFRAN-ODT) disintegrating tablet 4 mg  4 mg Oral Q8H PRN Dione Booze, MD       risperiDONE (RISPERDAL) tablet 0.5 mg  0.5 mg Oral Daily Dione Booze, MD   0.5 mg at 10/22/20 0757   risperiDONE (RISPERDAL) tablet 1 mg  1 mg Oral QHS Dione Booze, MD       traZODone (DESYREL) tablet 50 mg  50 mg Oral QHS PRN Dione Booze, MD       Current Outpatient Medications  Medication Sig Dispense Refill   mirtazapine (REMERON) 15 MG tablet Take 1 tablet (15 mg total) by mouth at bedtime. 30 tablet 0   risperiDONE (RISPERDAL) 0.5 MG tablet Take 1 tablet (0.5 mg total) by mouth daily. 30 tablet 0   risperiDONE (RISPERDAL) 1 MG tablet Take 1 tablet (1 mg total) by mouth at bedtime. 30 tablet 0   traZODone (DESYREL) 50 MG tablet Take 1 tablet (50 mg total) by mouth at bedtime as needed for sleep. 30 tablet 0    Musculoskeletal: Strength & Muscle Tone: within normal limits Gait & Station: normal Patient leans: N/A          Psychiatric Specialty Exam:  Presentation  General Appearance: Appropriate for Environment  Eye Contact:Good  Speech:Clear and Coherent; Normal Rate  Speech Volume:Normal  Handedness:Right   Mood and Affect  Mood:Euthymic  Affect:Appropriate; Congruent   Thought Process  Thought Processes:Coherent; Goal Directed  Descriptions of  Associations:Intact  Orientation:Full (Time, Place and Person)  Thought Content:Logical; WDL  History of Schizophrenia/Schizoaffective disorder:No  Duration of Psychotic Symptoms:Less than six months  Hallucinations:Hallucinations: None (Denies hallucinations at this time)  Ideas of Reference:None  Suicidal Thoughts:Suicidal Thoughts: No  Homicidal Thoughts:Homicidal Thoughts: No   Sensorium  Memory:Immediate Good; Recent Good  Judgment:Intact  Insight:Present   Executive Functions  Concentration:Good  Attention Span:Good  Recall:Good  Fund of Knowledge:Good  Language:Good   Psychomotor Activity  Psychomotor Activity:Psychomotor Activity: Normal   Assets  Assets:Desire for Improvement; Resilience   Sleep  Sleep:Sleep: Good    Physical Exam: Physical Exam Vitals and nursing note reviewed. Exam conducted with a chaperone present.  Constitutional:      General: He is not in acute distress.    Appearance: Normal appearance. He is not ill-appearing.  Cardiovascular:     Rate and Rhythm: Normal rate.  Pulmonary:     Effort: Pulmonary effort is normal.  Neurological:     Mental Status: He is alert and oriented to person, place, and time.  Psychiatric:        Attention and Perception: Attention and perception normal. He does  not perceive auditory or visual hallucinations.        Mood and Affect: Mood and affect normal.        Speech: Speech normal.        Behavior: Behavior normal. Behavior is cooperative.        Thought Content: Thought content normal. Thought content is not paranoid or delusional. Thought content does not include homicidal or suicidal ideation.        Cognition and Memory: Cognition and memory normal.        Judgment: Judgment normal.   Review of Systems  Constitutional: Negative.   HENT: Negative.    Eyes: Negative.   Respiratory: Negative.    Cardiovascular: Negative.   Gastrointestinal: Negative.   Genitourinary: Negative.    Musculoskeletal: Negative.   Skin: Negative.   Neurological: Negative.   Endo/Heme/Allergies: Negative.   Psychiatric/Behavioral:  Positive for substance abuse (Reporting substance use is his main stressor and wanting to get life together). Negative for memory loss. Depression: Stable. Hallucinations: Denies at this time. Suicidal ideas: Denies.The patient does not have insomnia. Nervous/anxious: Stable.       Patient reports he is able to contract for safety and denies suicidal/homicidal ideation but wants to get to the arrangement he has made for living and psychiatric services.  States they are aware and he and a friend set everything up he just needs transportation and not having it is what led him coming back to the hospital yesterday.    Blood pressure 133/81, pulse 60, temperature 97.8 F (36.6 C), temperature source Oral, resp. rate 19, SpO2 99 %. There is no height or weight on file to calculate BMI.  Treatment Plan Summary: Plan Psychiatrically clear.  Social work/TOC to arrange transportation for patients scheduled follow up care as stated below.    Ordered social work consult to make arrangement for follow up care:   Mr. Deem states he has arranged rehab services and outpatient services in [place], [place] but has no way to get there (reason for SI and coming back to the hospital).  States he has spoken to [employer]:  phone number [phone]; Address: [address] [place], [place]  [address].  Informed we could arrange his transportation but needed the address.  He will have services at [facility] but living arrangements have been established with [employer].  I am going to psychiatric clear him; just let me know when the arrangements have been made.    Discharge Instructions      Keep Scheduled follow up services with the following:  Outpatient psychiatric services and substance use services with  [facility] [address] [place], [place] [address] [phone]  Living arrangements with  Billie Ruddy  [address] [place] [address] Phone: [phone]     Disposition:  Psychiatrically cleared No evidence of imminent risk to self or others at present.   Patient does not meet criteria for psychiatric inpatient admission. Supportive therapy provided about ongoing stressors. Discussed crisis plan, support from social network, calling 911, coming to the Emergency Department, and calling Suicide Hotline.  This service was provided via telemedicine using a 2-way, interactive audio and video technology.  Names of all persons participating in this telemedicine service and their role in this encounter. Name: Assunta Found, NP Role: PMHNP  Name: Dr. Nelly Rout Role: Psychiatrist  Name: [name] Role: Patient  Name: Dalphine Handing, RN Role: Patients nurse sent a secure message informing:  Psychiatric consult completed.  Patient  psychiatrically cleared.  Able to contract for safety if he can get to place where he has set up follow up services for outpatient and substance use.  States he has Living with Gwendolyn Lima Hagler:  phone number [phone]; Address: [address] [place], [place]  [address]; and Outpatient psych/Substance use services with [facility]:  [address] [place], [place] [address]; [phone].  A social work/TOC consult has been ordered to arrange transportation.  Patient contracting for safety if he can get the transportation there.  States he has already spoke to [facility] just needs to get there.  Please inform MD only default listed.     Amnah Breuer, NP 10/22/2020 10:23 AM

## 2020-10-22 NOTE — ED Notes (Signed)
Pt is sleeping at this time, will get vitals once he is awake

## 2020-10-22 NOTE — Social Work (Addendum)
CSW met with Pt at bedside. Pt restated same information from chart that Ms. Hagler will be providing housing during rehab.  CSW attempted to reach Ms. Hagler via phone to confirm @ [phone] before initiating transportation.  Left HIPAA compliant voicemail.

## 2020-10-22 NOTE — Progress Notes (Signed)
CSW received message from floor RN stating that RN had made contact with Tristan Valdez who confirmed that she would be home to receive Pt.  Transportation arranged via [employer].

## 2020-10-22 NOTE — ED Notes (Signed)
Pt phone given to pt per TTS request for information on pt phone that TTS needed. Pt phone re-inventoried and placed back with security.

## 2020-10-22 NOTE — ED Notes (Signed)
Pt left with safe- transport. Social worker helped transported the pt to the vehicle.

## 2020-10-22 NOTE — Discharge Instructions (Signed)
Keep Scheduled follow up services with the following:  Outpatient psychiatric services and substance use services with  [facility] [address] [place], [place] [address] [phone]  Living arrangements with  Tristan Valdez  [address] [place] [address] Phone: [phone]

## 2020-10-22 NOTE — Progress Notes (Signed)
Patient information has been sent to [facility] via secure chat to review for potential admission. Patient meets inpatient criteria per Cecilio Asper, NP.   Situation ongoing, CSW will continue to monitor progress.    Signed:  Damita Dunnings, MSW, LCSW-A  10/22/2020 9:33 AM

## 2020-11-08 ENCOUNTER — Ambulatory Visit (HOSPITAL_COMMUNITY)
Admission: EM | Admit: 2020-11-08 | Discharge: 2020-11-08 | Disposition: A | Discharge status: Home / Self Care | Payer: No Typology Code available for payment source | Attending: Student | Admitting: Student

## 2020-11-08 ENCOUNTER — Other Ambulatory Visit: Payer: Self-pay

## 2020-11-08 ENCOUNTER — Encounter (HOSPITAL_COMMUNITY): Payer: Self-pay | Admitting: Emergency Medicine

## 2020-11-08 DIAGNOSIS — H6502 Acute serous otitis media, left ear: Secondary | ICD-10-CM

## 2020-11-08 MED ORDER — AMOXICILLIN 875 MG PO TABS: 875.0000 mg | ORAL_TABLET | Freq: Two times a day (BID) | ORAL | 0 refills | Status: AC

## 2020-11-08 NOTE — ED Triage Notes (Signed)
Left ear pain for 2 days and sounds like liquid in ear per patient

## 2020-11-08 NOTE — Discharge Instructions (Addendum)
-  Start the antibiotic-Amoxicillin, 1 pill every 12 hours for 7 days.  You can take this with food like with breakfast and dinner. -Tylenol/ibuprofen for discomfort

## 2020-11-08 NOTE — ED Provider Notes (Signed)
[facility]    CSN: [id number] Arrival date & time: 11/08/20  1249      History   Chief Complaint Chief Complaint  Patient presents with   Otalgia    HPI Daryn Hicks is a 50 y.o. male presenting with L ear pain and pressure for 2 days, muffled hearing.  Medical history substance abuse, hypertension, AOM. 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 Authorizing Provider  amoxicillin (AMOXIL) 875 MG tablet Take 1 tablet (875 mg total) by mouth 2 (two) times daily for 7 days. 11/08/20 11/15/20 Yes Rhys Martini, PA-C    Family History Family History  Problem Relation Age of Onset   CAD Mother    CAD Father     Social History Social History   Tobacco Use   Smoking status: Every Day    Packs/day: 1.00    Types: Cigarettes   Smokeless tobacco: Never   Tobacco comments:    Last used: 2 days ago  Vaping Use   Vaping Use: Some days  Substance Use Topics   Alcohol use: Never   Drug use: Not Currently    Types: Methamphetamines    Comment: Fentanyl     Allergies   Patient has no known allergies.   Review of Systems Review of Systems  Constitutional:  Negative for appetite change, chills and fever.  HENT:  Positive for ear pain. Negative for congestion,  rhinorrhea, sinus pressure, sinus pain and sore throat.   Eyes:  Negative for redness and visual disturbance.  Respiratory:  Negative for cough, chest tightness, shortness of breath and wheezing.   Cardiovascular:  Negative for chest pain and palpitations.  Gastrointestinal:  Negative for abdominal pain, constipation, diarrhea, nausea and vomiting.  Genitourinary:  Negative for dysuria, frequency and urgency.  Musculoskeletal:  Negative for myalgias.  Neurological:  Negative for dizziness, weakness and headaches.  Psychiatric/Behavioral:  Negative for confusion.   All other systems reviewed and are negative.   Physical Exam Triage Vital Signs ED Triage Vitals  Enc Vitals Group     BP 11/08/20 1410 131/85     Pulse Rate 11/08/20 1410 68     Resp 11/08/20 1410 20     Temp 11/08/20 1410 97.9 F (36.6 C)     Temp Source 11/08/20 1410 Oral     SpO2 11/08/20 1410 100 %     Weight --      Height --      Head Circumference --      Peak Flow --      Pain Score 11/08/20 1405 5     Pain Loc --      Pain Edu? --      Excl. in GC? --  No data found.  Updated Vital Signs BP 131/85 (BP Location: Left Arm)   Pulse 68   Temp 97.9 F (36.6 C) (Oral)   Resp 20   SpO2 100%   Visual Acuity Right Eye Distance:   Left Eye Distance:   Bilateral Distance:    Right Eye Near:   Left Eye Near:    Bilateral Near:     Physical Exam Vitals reviewed.  Constitutional:      Appearance: Normal appearance. He is not ill-appearing.  HENT:     Head: Normocephalic and atraumatic.     Right Ear: Hearing, tympanic membrane, ear canal and external ear normal. No swelling or tenderness. No middle ear effusion. There is no impacted cerumen. No mastoid tenderness. Tympanic membrane is not injected, scarred, perforated, erythematous, retracted or bulging.     Left Ear: Hearing, ear canal and external ear normal. Tenderness present. No swelling.  No middle ear effusion. There is no impacted cerumen. No  mastoid tenderness. Tympanic membrane is erythematous. Tympanic membrane is not injected, scarred, perforated, retracted or bulging.     Mouth/Throat:     Pharynx: Oropharynx is clear. No oropharyngeal exudate or posterior oropharyngeal erythema.  Cardiovascular:     Rate and Rhythm: Normal rate and regular rhythm.     Heart sounds: Normal heart sounds.  Pulmonary:     Effort: Pulmonary effort is normal.     Breath sounds: Normal breath sounds.  Lymphadenopathy:     Cervical: No cervical adenopathy.  Neurological:     General: No focal deficit present.     Mental Status: He is alert and oriented to person, place, and time.  Psychiatric:        Mood and Affect: Mood normal.        Behavior: Behavior normal.        Thought Content: Thought content normal.        Judgment: Judgment normal.     UC Treatments / Results  Labs (all labs ordered are listed, but only abnormal results are displayed) Labs Reviewed - No data to display  EKG   Radiology No results found.  Procedures Procedures (including critical care time)  Medications Ordered in UC Medications - No data to display  Initial Impression / Assessment and Plan / UC Course  I have reviewed the triage vital signs and the nursing notes.  Pertinent labs & imaging results that were available during my care of the patient were reviewed by me and considered in my medical decision making (see chart for details).     This patient is a very pleasant 50 y.o. year old male presenting with L AOM.  Afebrile, nontachycardic.  Denies recent URI, allergic rhinitis. Amoxicilln as below. ED return precautions discussed. Patient verbalizes understanding and agreement.    Final Clinical Impressions(s) / UC Diagnoses   Final diagnoses:  Non-recurrent acute serous otitis media of left ear     Discharge Instructions      -Start the antibiotic-Amoxicillin, 1 pill every 12 hours for 7 days.  You can take this with food like with  breakfast and dinner. -Tylenol/ibuprofen for discomfort     ED Prescriptions     Medication Sig Dispense Auth. Provider   amoxicillin (AMOXIL) 875 MG tablet Take 1 tablet (875 mg total) by mouth 2 (two) times daily for 7 days. 14 tablet Rhys Martini, PA-C      PDMP not reviewed this encounter.   Rhys Martini, PA-C 11/08/20 1437

## 2020-11-13 ENCOUNTER — Other Ambulatory Visit: Payer: Self-pay

## 2020-11-13 ENCOUNTER — Encounter (HOSPITAL_COMMUNITY): Payer: Self-pay | Admitting: Emergency Medicine

## 2020-11-13 ENCOUNTER — Emergency Department (HOSPITAL_COMMUNITY)
Admission: EM | Admit: 2020-11-13 | Discharge: 2020-11-14 | Disposition: A | Discharge status: Other Acute Inpatient Hospital | Payer: No Typology Code available for payment source | Attending: Emergency Medicine | Admitting: Emergency Medicine

## 2020-11-13 DIAGNOSIS — Z20822 Contact with and (suspected) exposure to covid-19: Secondary | ICD-10-CM | POA: Diagnosis not present

## 2020-11-13 DIAGNOSIS — F17210 Nicotine dependence, cigarettes, uncomplicated: Secondary | ICD-10-CM | POA: Diagnosis not present

## 2020-11-13 DIAGNOSIS — I10 Essential (primary) hypertension: Secondary | ICD-10-CM | POA: Diagnosis not present

## 2020-11-13 DIAGNOSIS — F331 Major depressive disorder, recurrent, moderate: Secondary | ICD-10-CM | POA: Insufficient documentation

## 2020-11-13 DIAGNOSIS — F1510 Other stimulant abuse, uncomplicated: Secondary | ICD-10-CM | POA: Diagnosis not present

## 2020-11-13 DIAGNOSIS — R45851 Suicidal ideations: Secondary | ICD-10-CM

## 2020-11-13 LAB — CBC WITH DIFFERENTIAL/PLATELET
Abs Immature Granulocytes: 0.01 10*3/uL (ref 0.00–0.07)
Basophils Absolute: 0 10*3/uL (ref 0.0–0.1)
Basophils Relative: 0 %
Eosinophils Absolute: 0.2 10*3/uL (ref 0.0–0.5)
Eosinophils Relative: 3 %
HCT: 45 % (ref 39.0–52.0)
Hemoglobin: 14.6 g/dL (ref 13.0–17.0)
Immature Granulocytes: 0 %
Lymphocytes Relative: 30 %
Lymphs Abs: 1.8 10*3/uL (ref 0.7–4.0)
MCH: 30.5 pg (ref 26.0–34.0)
MCHC: 32.4 g/dL (ref 30.0–36.0)
MCV: 93.9 fL (ref 80.0–100.0)
Monocytes Absolute: 0.8 10*3/uL (ref 0.1–1.0)
Monocytes Relative: 14 %
Neutro Abs: 3.2 10*3/uL (ref 1.7–7.7)
Neutrophils Relative %: 53 %
Platelets: 181 10*3/uL (ref 150–400)
RBC: 4.79 MIL/uL (ref 4.22–5.81)
RDW: 13.5 % (ref 11.5–15.5)
WBC: 6 10*3/uL (ref 4.0–10.5)
nRBC: 0 % (ref 0.0–0.2)

## 2020-11-13 LAB — COMPREHENSIVE METABOLIC PANEL
ALT: 40 U/L (ref 0–44)
AST: 23 U/L (ref 15–41)
Albumin: 3.6 g/dL (ref 3.5–5.0)
Alkaline Phosphatase: 49 U/L (ref 38–126)
Anion gap: 6 (ref 5–15)
BUN: 14 mg/dL (ref 6–20)
CO2: 27 mmol/L (ref 22–32)
Calcium: 8.8 mg/dL — ABNORMAL LOW (ref 8.9–10.3)
Chloride: 103 mmol/L (ref 98–111)
Creatinine, Ser: 0.85 mg/dL (ref 0.61–1.24)
GFR, Estimated: >60 mL/min (ref >60)
Glucose, Bld: 92 mg/dL (ref 70–99)
Potassium: 4.2 mmol/L (ref 3.5–5.1)
Sodium: 136 mmol/L (ref 135–145)
Total Bilirubin: 1 mg/dL (ref 0.3–1.2)
Total Protein: 6.3 g/dL — ABNORMAL LOW (ref 6.5–8.1)

## 2020-11-13 LAB — ETHANOL: Alcohol, Ethyl (B): <10 mg/dL (ref <10)

## 2020-11-13 LAB — SALICYLATE LEVEL: Salicylate Lvl: <7 mg/dL — ABNORMAL LOW (ref 7.0–30.0)

## 2020-11-13 LAB — RAPID URINE DRUG SCREEN, HOSP PERFORMED
Amphetamines: POSITIVE — AB
Barbiturates: NOT DETECTED
Benzodiazepines: NOT DETECTED
Cocaine: NOT DETECTED
Opiates: NOT DETECTED
Tetrahydrocannabinol: NOT DETECTED

## 2020-11-13 LAB — ACETAMINOPHEN LEVEL: Acetaminophen (Tylenol), Serum: <10 ug/mL — ABNORMAL LOW (ref 10–30)

## 2020-11-13 LAB — RESP PANEL BY RT-PCR (FLU A&B, COVID) ARPGX2
Influenza A by PCR: NEGATIVE
Influenza B by PCR: NEGATIVE
SARS Coronavirus 2 by RT PCR: NEGATIVE

## 2020-11-13 NOTE — ED Triage Notes (Signed)
Patient states that he feels like giving up and his is having suicidal thoughts.  He is homeless and having work stressors.
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
## 2020-11-13 NOTE — ED Notes (Signed)
No sitter is available.  Door is open, patient resting on stretcher.  No complaints at this time.

## 2020-11-13 NOTE — ED Provider Notes (Signed)
[facility] Provider T[facility] Evaluation Note  Dezman Granda , a 50 y.o. male  was evaluated in triage.  Pt complains of suicidal and homicidal ideations, without a specific plan.  Denies hallucinations, delusions, that he took fentanyl as well as methamphetamines.  States he has attempted suicide in the past.  Has no other complaints at this time..  Review of Systems  Positive: Suicidal, homicidal ideations Negative: Denies hallucinations, delusions  Physical Exam  BP 127/75 (BP Location: Right Arm)   Pulse 61   Temp 98.5 F (36.9 C) (Oral)   Resp (!) 22   SpO2 100%  Gen:   Awake, no distress   Resp:  Normal effort  MSK:   Moves extremities without difficulty  Other:    Medical Decision Making  Medically screening exam initiated at 7:58 PM.  Appropriate orders placed.  [name] was informed that the remainder of the evaluation will be completed by another provider, this initial triage assessment does not replace that evaluation, and the importance of remaining in the ED until their evaluation is complete.  Presents with suicidal homicidal ideations, will need further work-up.   Carroll Sage, PA-C 11/13/20 Andres Shad, MD 11/17/20 1137

## 2020-11-13 NOTE — ED Notes (Signed)
Called for triage, no answer

## 2020-11-13 NOTE — BH Assessment (Signed)
Contacted RN [employer] who stated pt is not able to participate in TTS assessment at this time.   Tristan Valdez T. Jimmye Norman, MS, [facility], [facility] Triage Specialist [facility]

## 2020-11-13 NOTE — ED Notes (Signed)
Provided with sandwich bag

## 2020-11-13 NOTE — ED Provider Notes (Signed)
[facility] EMERGENCY DEPARTMENT Provider Note   CSN: [id number] Arrival date & time: 11/13/20  1650     History Chief Complaint  Patient presents with   Suicidal    Tristan Valdez is a 50 y.o. male.  Patient complains of depression and suicidal ideation  The history is provided by the patient and medical records. No language interpreter was used.  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   Suicidal ideations 10/07/2020   Substance induced mood disorder (HCC) 10/07/2020   Polysubstance abuse (HCC)    TOBACCO DEPENDENCE 06/23/2006   GASTROESOPHAGEAL REFLUX, NO ESOPHAGITIS 06/23/2006   PSORIASIS 06/23/2006    Past Surgical History:  Procedure Laterality Date   APPENDECTOMY     reconstruction of tendons of left arm Left        Family History  Problem Relation Age of Onset   CAD Mother    CAD Father     Social History   Tobacco Use   Smoking status: Every Day    Packs/day: 1.00    Types: Cigarettes   Smokeless tobacco: Never   Tobacco comments:    Last used: 2 days ago  Vaping Use   Vaping Use: Some days  Substance Use Topics   Alcohol use: Never   Drug use: Not Currently    Types: Methamphetamines    Comment: Fentanyl    Home Medications Prior to Admission medications   Medication Sig Start Date End Date Taking? Authorizing Provider  amoxicillin (AMOXIL) 875 MG tablet Take 1 tablet (875 mg total) by mouth 2  (two) times daily for 7 days. 11/08/20 11/15/20  Rhys Martini, PA-C    Allergies    Patient has no known allergies.  Review of Systems   Review of Systems  Constitutional:  Negative for appetite change and fatigue.  HENT:  Negative for congestion, ear discharge and sinus pressure.   Eyes:  Negative for discharge.  Respiratory:  Negative for cough.   Cardiovascular:  Negative for chest pain.  Gastrointestinal:  Negative for abdominal pain and diarrhea.  Genitourinary:  Negative for frequency and hematuria.  Musculoskeletal:  Negative for back pain.  Skin:  Negative for rash.  Neurological:  Negative for seizures and headaches.  Psychiatric/Behavioral:  Negative for hallucinations.        Depressed and suicidal   Physical Exam Updated Vital Signs BP (!) 125/92   Pulse (!) 57   Temp 98.5 F (36.9 C) (Oral)   Resp 20   SpO2 97%   Physical Exam Vitals and nursing note reviewed.  Constitutional:      Appearance: He is well-developed.  HENT:     Head: Normocephalic.     Nose: Nose normal.  Eyes:     General: No scleral icterus.    Conjunctiva/sclera: Conjunctivae normal.  Neck:     Thyroid: No thyromegaly.  Cardiovascular:     Rate and Rhythm: Normal rate and regular  rhythm.     Heart sounds: No murmur heard.   No friction rub. No gallop.  Pulmonary:     Breath sounds: No stridor. No wheezing or rales.  Chest:     Chest wall: No tenderness.  Abdominal:     General: There is no distension.     Tenderness: There is no abdominal tenderness. There is no rebound.  Musculoskeletal:        General: Normal range of motion.     Cervical back: Neck supple.  Lymphadenopathy:     Cervical: No cervical adenopathy.  Skin:    Findings: No erythema or rash.  Neurological:     Mental Status: He is oriented to person, place, and time.     Motor: No abnormal muscle tone.     Coordination: Coordination normal.  Psychiatric:     Comments: Patient is suicidal    ED Results /  Procedures / Treatments   Labs (all labs ordered are listed, but only abnormal results are displayed) Labs Reviewed  COMPREHENSIVE METABOLIC PANEL - Abnormal; Notable for the following components:      Result Value   Calcium 8.8 (*)    Total Protein 6.3 (*)    All other components within normal limits  RAPID URINE DRUG SCREEN, HOSP PERFORMED - Abnormal; Notable for the following components:   Amphetamines POSITIVE (*)    All other components within normal limits  ACETAMINOPHEN LEVEL - Abnormal; Notable for the following components:   Acetaminophen (Tylenol), Serum <10 (*)    All other components within normal limits  SALICYLATE LEVEL - Abnormal; Notable for the following components:   Salicylate Lvl <7.0 (*)    All other components within normal limits  RESP PANEL BY RT-PCR (FLU A&B, COVID) ARPGX2  ETHANOL  CBC WITH DIFFERENTIAL/PLATELET    EKG None  Radiology No results found.  Procedures Procedures   Medications Ordered in ED Medications - No data to display  ED Course  I have reviewed the triage vital signs and the nursing notes.  Pertinent labs & imaging results that were available during my care of the patient were reviewed by me and considered in my medical decision making (see chart for details).    MDM Rules/Calculators/A&P                           Patient is suicidal and medically cleared.  We will consult TTS Final Clinical Impression(s) / ED Diagnoses Final diagnoses:  None    Rx / DC Orders ED Discharge Orders     None        Bethann Berkshire, MD 11/13/20 2222

## 2020-11-14 ENCOUNTER — Ambulatory Visit (HOSPITAL_COMMUNITY)
Admission: EM | Admit: 2020-11-14 | Discharge: 2020-11-15 | Disposition: A | Discharge status: Home / Self Care | Payer: No Typology Code available for payment source | Attending: Psychiatry | Admitting: Psychiatry

## 2020-11-14 ENCOUNTER — Encounter (HOSPITAL_COMMUNITY): Payer: Self-pay | Admitting: Emergency Medicine

## 2020-11-14 DIAGNOSIS — F332 Major depressive disorder, recurrent severe without psychotic features: Secondary | ICD-10-CM

## 2020-11-14 DIAGNOSIS — R45851 Suicidal ideations: Secondary | ICD-10-CM

## 2020-11-14 DIAGNOSIS — F1994 Other psychoactive substance use, unspecified with psychoactive substance-induced mood disorder: Secondary | ICD-10-CM | POA: Diagnosis not present

## 2020-11-14 MED ORDER — NICOTINE 14 MG/24HR TD PT24
14.0000 mg | MEDICATED_PATCH | Freq: Every day | TRANSDERMAL | Status: DC
Start: 2020-11-14 — End: 2020-11-15
  Administered 2020-11-14: 14 mg via TRANSDERMAL
  Filled 2020-11-14: qty 1

## 2020-11-14 MED ORDER — TRAZODONE HCL 50 MG PO TABS: 50.0000 mg | ORAL_TABLET | Freq: Every day | ORAL | Status: DC

## 2020-11-14 MED ORDER — HYDROXYZINE HCL 25 MG PO TABS: 25.0000 mg | ORAL_TABLET | Freq: Three times a day (TID) | ORAL | Status: DC | PRN

## 2020-11-14 MED ORDER — ACETAMINOPHEN 325 MG PO TABS: 650.0000 mg | ORAL_TABLET | Freq: Four times a day (QID) | ORAL | Status: DC | PRN

## 2020-11-14 MED ORDER — MIRTAZAPINE 15 MG PO TABS
15.0000 mg | ORAL_TABLET | Freq: Every day | ORAL | Status: DC
  Administered 2020-11-14: 15 mg via ORAL
  Filled 2020-11-14: qty 1

## 2020-11-14 MED ORDER — TRAZODONE HCL 50 MG PO TABS: 50.0000 mg | ORAL_TABLET | Freq: Every evening | ORAL | Status: DC | PRN

## 2020-11-14 NOTE — ED Notes (Signed)
Pt sleeping@this time. Breathing even and unlabored. Will continue to monitor for safety 

## 2020-11-14 NOTE — Progress Notes (Signed)
Patient has been accepted for transfer to [facility]. Accepting provider Cecilio Asper, NP. Call report to [phone]
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
## 2020-11-14 NOTE — ED Notes (Signed)
Patient sleeping. No distress noted.  Will continue to monitor

## 2020-11-14 NOTE — ED Notes (Addendum)
Pt cooperative with staff. Pt denies SI but endorse hopelessness due to financial difficulties. Pt states, "I don't have anyone helping me. Pt questioned long term rehab tx. Support given. Informed pt to notify staff with any needs or concerns. Will continue to monitor for safety.

## 2020-11-14 NOTE — ED Notes (Signed)
Pt sleeping at present, no distress noted, monitoring for safety. 

## 2020-11-14 NOTE — ED Provider Notes (Signed)
Behavioral Health Progress Note  Date and Time: 11/14/2020 5:51 PM Name: Tristan Valdez MRN:  [id number]  Subjective:  Patient is seen and examined today.  Patient states his mood is not good and rates it 0/10 (10 is the best mood).  Patient rates his anxiety at a 10/10 (10 is worse anxiety).  Patient denies any active suicidal ideation but cannot contract for safety at this time..  States yesterday he was thinking of overdosing on fentanyl but not today.  He denies HI, AVH.  He states he was living at a hotel with his girlfriend but his girlfriend did not call him recently and now he has nowhere to go. He states yesterday he was having thoughts of hurting his girlfriends ex boyfriend states "because he started a bunch of shit".  He states he is planning to go to outpatient substance abuse program [facility] in [place].  He states he already called them and arranged housing and job. 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 He states he does not want to stay in [place] anymore. Pt gave Phone number of person Gwendolyn Lima  [phone])who confirmed that Pt can go to [facility] for  outpatient substance abuse rehab.  Diagnosis:  Final diagnoses:  Substance induced mood disorder (HCC)  Suicidal ideation  MDD (major depressive disorder), recurrent severe, without psychosis (HCC)    Total Time spent with patient: 20 minutes  Past Psychiatric History: see H&P Past Medical History:  Past Medical History:  Diagnosis Date   Arthritis    Bilateral club feet    Hypertension     Past Surgical History:  Procedure Laterality Date   APPENDECTOMY     reconstruction of tendons of left arm Left    Family History:  Family History  Problem Relation Age of Onset   CAD Mother    CAD Father    Family Psychiatric  History: see H&P Social History:  Social History   Substance and Sexual  Activity  Alcohol Use Never     Social History   Substance and Sexual Activity  Drug Use Not Currently   Types: Methamphetamines   Comment: Fentanyl    Social History   Socioeconomic History   Marital status: Divorced    Spouse name: Not on file   Number of children: Not on file   Years of education: Not on file   Highest education level: Not on file  Occupational History   Not on file  Tobacco Use   Smoking status: Every Day    Packs/day: 1.00    Types: Cigarettes   Smokeless tobacco: Never   Tobacco comments:    Last used: 2 days ago  Vaping Use   Vaping Use: Some days  Substance and Sexual Activity   Alcohol use: Never   Drug use: Not Currently    Types: Methamphetamines    Comment: Fentanyl   Sexual activity: Not Currently  Other Topics Concern   Not on file  Social History Narrative   Not on file   Social Determinants of Health   Financial Resource Strain: Not on file  Food Insecurity: Not on file  Transportation Needs: Not on file  Physical Activity: Not on file  Stress: Not on file  Social Connections: Not on file   SDOH:  SDOH Screenings   Alcohol Screen: Low Risk    Last  Alcohol Screening Score (AUDIT): 0  Depression (PHQ2-9): Not on file  Financial Resource Strain: Not on file  Food Insecurity: Not on file  Housing: Not on file  Physical Activity: Not on file  Social Connections: Not on file  Stress: Not on file  Tobacco Use: High Risk   Smoking Tobacco Use: Every Day   Smokeless Tobacco Use: Never  Transportation Needs: Not on file   Additional Social History:                         Sleep: Fair  Appetite:  Good  Current Medications:  Current Facility-Administered Medications  Medication Dose Route Frequency Provider Last Rate Last Admin   acetaminophen (TYLENOL) tablet 650 mg  650 mg Oral Q6H PRN Karsten Ro, MD       hydrOXYzine (ATARAX/VISTARIL) tablet 25 mg  25 mg Oral TID PRN Karsten Ro, MD       mirtazapine  (REMERON) tablet 15 mg  15 mg Oral QHS Alexes Lamarque, MD       nicotine (NICODERM CQ - dosed in mg/24 hours) patch 14 mg  14 mg Transdermal Daily Karsten Ro, MD   14 mg at 11/14/20 1241   traZODone (DESYREL) tablet 50 mg  50 mg Oral QHS Karsten Ro, MD       Current Outpatient Medications  Medication Sig Dispense Refill   amoxicillin (AMOXIL) 875 MG tablet Take 1 tablet (875 mg total) by mouth 2 (two) times daily for 7 days. 14 tablet 0    Labs  Lab Results:  Admission on 11/13/2020, Discharged on 11/14/2020  Component Date Value Ref Range Status   SARS Coronavirus 2 by RT PCR 11/13/2020 NEGATIVE  NEGATIVE Final   Comment: (NOTE) SARS-CoV-2 target nucleic acids are NOT DETECTED.  The SARS-CoV-2 RNA is generally detectable in upper respiratory specimens during the acute phase of infection. The lowest concentration of SARS-CoV-2 viral copies this assay can detect is 138 copies/mL. A negative result does not preclude SARS-Cov-2 infection and should not be used as the sole basis for treatment or other patient management decisions. A negative result may occur with  improper specimen collection/handling, submission of specimen other than nasopharyngeal swab, presence of viral mutation(s) within the areas targeted by this assay, and inadequate number of viral copies(<138 copies/mL). A negative result must be combined with clinical observations, patient history, and epidemiological information. The expected result is Negative.  Fact Sheet for Patients:  BloggerCourse.com  Fact Sheet for Healthcare Providers:  SeriousBroker.it  This test is no                          t yet approved or cleared by the [place] FDA and  has been authorized for detection and/or diagnosis of SARS-CoV-2 by FDA under an Emergency Use Authorization (EUA). This EUA will remain  in effect (meaning this test can be used) for the duration of the COVID-19  declaration under Section 564(b)(1) of the Act, 21 U.S.C.section 360bbb-3(b)(1), unless the authorization is terminated  or revoked sooner.       Influenza A by PCR 11/13/2020 NEGATIVE  NEGATIVE Final   Influenza B by PCR 11/13/2020 NEGATIVE  NEGATIVE Final   Comment: (NOTE) The Xpert Xpress SARS-CoV-2/FLU/RSV plus assay is intended as an aid in the diagnosis of influenza from Nasopharyngeal swab specimens and should not be used as a sole basis for treatment. Nasal washings and aspirates are unacceptable for Xpert  Xpress SARS-CoV-2/FLU/RSV testing.  Fact Sheet for Patients: BloggerCourse.com  Fact Sheet for Healthcare Providers: SeriousBroker.it  This test is not yet approved or cleared by the [place] FDA and has been authorized for detection and/or diagnosis of SARS-CoV-2 by FDA under an Emergency Use Authorization (EUA). This EUA will remain in effect (meaning this test can be used) for the duration of the COVID-19 declaration under Section 564(b)(1) of the Act, 21 U.S.C. section 360bbb-3(b)(1), unless the authorization is terminated or revoked.  Performed at [facility], [address], [place], [place] [address]    Sodium 11/13/2020 136  135 - 145 mmol/L Final   Potassium 11/13/2020 4.2  3.5 - 5.1 mmol/L Final   Chloride 11/13/2020 103  98 - 111 mmol/L Final   CO2 11/13/2020 27  22 - 32 mmol/L Final   Glucose, Bld 11/13/2020 92  70 - 99 mg/dL Final   Glucose reference range applies only to samples taken after fasting for at least 8 hours.   BUN 11/13/2020 14  6 - 20 mg/dL Final   Creatinine, Ser 11/13/2020 0.85  0.61 - 1.24 mg/dL Final   Calcium 23/76/2831 8.8 (A) 8.9 - 10.3 mg/dL Final   Total Protein 51/76/1607 6.3 (A) 6.5 - 8.1 g/dL Final   Albumin 37/01/6268 3.6  3.5 - 5.0 g/dL Final   AST 48/54/6270 23  15 - 41 U/L Final   ALT 11/13/2020 40  0 - 44 U/L Final   Alkaline Phosphatase 11/13/2020 49  38 -  126 U/L Final   Total Bilirubin 11/13/2020 1.0  0.3 - 1.2 mg/dL Final   GFR, Estimated 11/13/2020 >60  >60 mL/min Final   Comment: (NOTE) Calculated using the CKD-EPI Creatinine Equation (2021)    Anion gap 11/13/2020 6  5 - 15 Final   Performed at [facility], [address], [place], [place] [address]   Alcohol, Ethyl (B) 11/13/2020 <10  <10 mg/dL Final   Comment: (NOTE) Lowest detectable limit for serum alcohol is 10 mg/dL.  For medical purposes only. Performed at [facility], [address], [place], [place] [address]    Opiates 11/13/2020 NONE DETECTED  NONE DETECTED Final   Cocaine 11/13/2020 NONE DETECTED  NONE DETECTED Final   Benzodiazepines 11/13/2020 NONE DETECTED  NONE DETECTED Final   Amphetamines 11/13/2020 POSITIVE (A) NONE DETECTED Final   Tetrahydrocannabinol 11/13/2020 NONE DETECTED  NONE DETECTED Final   Barbiturates 11/13/2020 NONE DETECTED  NONE DETECTED Final   Comment: (NOTE) DRUG SCREEN FOR MEDICAL PURPOSES ONLY.  IF CONFIRMATION IS NEEDED FOR ANY PURPOSE, NOTIFY LAB WITHIN 5 DAYS.  LOWEST DETECTABLE LIMITS FOR URINE DRUG SCREEN Drug Class                     Cutoff (ng/mL) Amphetamine and metabolites    1000 Barbiturate and metabolites    200 Benzodiazepine                 200 Tricyclics and metabolites     300 Opiates and metabolites        300 Cocaine and metabolites        300 THC                            50 Performed at [facility], [address] [place], [place] [address]    WBC 11/13/2020 6.0  4.0 - 10.5 K/uL Final   RBC  11/13/2020 4.79  4.22 - 5.81 MIL/uL Final   Hemoglobin 11/13/2020 14.6  13.0 - 17.0 g/dL Final   HCT 16/01/9603 45.0  39.0 - 52.0 % Final   MCV 11/13/2020 93.9  80.0 - 100.0 fL Final   MCH 11/13/2020 30.5  26.0 - 34.0 pg Final   MCHC 11/13/2020 32.4  30.0 - 36.0 g/dL Final   RDW 54/12/8117 13.5  11.5 - 15.5 % Final   Platelets 11/13/2020 181  150 - 400 K/uL Final   nRBC 11/13/2020 0.0  0.0 - 0.2  % Final   Neutrophils Relative % 11/13/2020 53  % Final   Neutro Abs 11/13/2020 3.2  1.7 - 7.7 K/uL Final   Lymphocytes Relative 11/13/2020 30  % Final   Lymphs Abs 11/13/2020 1.8  0.7 - 4.0 K/uL Final   Monocytes Relative 11/13/2020 14  % Final   Monocytes Absolute 11/13/2020 0.8  0.1 - 1.0 K/uL Final   Eosinophils Relative 11/13/2020 3  % Final   Eosinophils Absolute 11/13/2020 0.2  0.0 - 0.5 K/uL Final   Basophils Relative 11/13/2020 0  % Final   Basophils Absolute 11/13/2020 0.0  0.0 - 0.1 K/uL Final   Immature Granulocytes 11/13/2020 0  % Final   Abs Immature Granulocytes 11/13/2020 0.01  0.00 - 0.07 K/uL Final   Performed at [facility], [address], [place], [place] [address]   Acetaminophen (Tylenol), Serum 11/13/2020 <10 (A) 10 - 30 ug/mL Final   Comment: (NOTE) Therapeutic concentrations vary significantly. A range of 10-30 ug/mL  may be an effective concentration for many patients. However, some  are best treated at concentrations outside of this range. Acetaminophen concentrations >150 ug/mL at 4 hours after ingestion  and >50 ug/mL at 12 hours after ingestion are often associated with  toxic reactions.  Performed at [facility], [address], [place], [place] [address]    Salicylate Lvl 11/13/2020 <7.0 (A) 7.0 - 30.0 mg/dL Final   Performed at [facility], [address], [place], [place] [address]  Admission on 10/21/2020, Discharged on 10/22/2020  Component Date Value Ref Range Status   Sodium 10/21/2020 137  135 - 145 mmol/L Final   Potassium 10/21/2020 3.4 (A) 3.5 - 5.1 mmol/L Final   Chloride 10/21/2020 101  98 - 111 mmol/L Final   CO2 10/21/2020 30  22 - 32 mmol/L Final   Glucose, Bld 10/21/2020 112 (A) 70 - 99 mg/dL Final   Glucose reference range applies only to samples taken after fasting for at least 8 hours.   BUN 10/21/2020 14  6 - 20 mg/dL Final   Creatinine, Ser 10/21/2020 0.99  0.61 - 1.24 mg/dL Final   Calcium 78/46/9629 8.9   8.9 - 10.3 mg/dL Final   Total Protein 52/84/1324 5.9 (A) 6.5 - 8.1 g/dL Final   Albumin 40/01/2724 3.4 (A) 3.5 - 5.0 g/dL Final   AST 36/64/4034 26  15 - 41 U/L Final   ALT 10/21/2020 34  0 - 44 U/L Final   Alkaline Phosphatase 10/21/2020 57  38 - 126 U/L Final   Total Bilirubin 10/21/2020 1.2  0.3 - 1.2 mg/dL Final   GFR, Estimated 10/21/2020 >60  >60 mL/min Final   Comment: (NOTE) Calculated using the CKD-EPI Creatinine Equation (2021)    Anion gap 10/21/2020 6  5 - 15 Final   Performed at [facility], [address] [place], [place] [address]   Alcohol, Ethyl (B) 10/21/2020 <10  <  10 mg/dL Final   Comment: (NOTE) Lowest detectable limit for serum alcohol is 10 mg/dL.  For medical purposes only. Performed at [facility], [address], [place], [place] [address]    Salicylate Lvl 10/21/2020 <7.0 (A) 7.0 - 30.0 mg/dL Final   Performed at [facility], [address] [place], [place] [address]   Acetaminophen (Tylenol), Serum 10/21/2020 <10 (A) 10 - 30 ug/mL Final   Comment: (NOTE) Therapeutic concentrations vary significantly. A range of 10-30 ug/mL  may be an effective concentration for many patients. However, some  are best treated at concentrations outside of this range. Acetaminophen concentrations >150 ug/mL at 4 hours after ingestion  and >50 ug/mL at 12 hours after ingestion are often associated with  toxic reactions.  Performed at [facility], [address] [place], [place] [address]    WBC 10/21/2020 8.2  4.0 - 10.5 K/uL Final   RBC 10/21/2020 4.54  4.22 - 5.81 MIL/uL Final   Hemoglobin 10/21/2020 13.9  13.0 - 17.0 g/dL Final   HCT 91/47/8295 42.5  39.0 - 52.0 % Final   MCV 10/21/2020 93.6  80.0 - 100.0 fL Final   MCH 10/21/2020 30.6  26.0 - 34.0 pg Final   MCHC 10/21/2020 32.7  30.0 - 36.0 g/dL Final   RDW 62/13/0865 13.1  11.5 - 15.5 % Final   Platelets 10/21/2020 183  150 - 400 K/uL Final   nRBC 10/21/2020 0.0  0.0 - 0.2 %  Final   Performed at [facility], [address], [place], [place] [address]   Opiates 10/21/2020 NONE DETECTED  NONE DETECTED Final   Cocaine 10/21/2020 NONE DETECTED  NONE DETECTED Final   Benzodiazepines 10/21/2020 NONE DETECTED  NONE DETECTED Final   Amphetamines 10/21/2020 POSITIVE (A) NONE DETECTED Final   Tetrahydrocannabinol 10/21/2020 NONE DETECTED  NONE DETECTED Final   Barbiturates 10/21/2020 NONE DETECTED  NONE DETECTED Final   Comment: (NOTE) DRUG SCREEN FOR MEDICAL PURPOSES ONLY.  IF CONFIRMATION IS NEEDED FOR ANY PURPOSE, NOTIFY LAB WITHIN 5 DAYS.  LOWEST DETECTABLE LIMITS FOR URINE DRUG SCREEN Drug Class                     Cutoff (ng/mL) Amphetamine and metabolites    1000 Barbiturate and metabolites    200 Benzodiazepine                 200 Tricyclics and metabolites     300 Opiates and metabolites        300 Cocaine and metabolites        300 THC                            50 Performed at [facility], [address] [place], [place] [address]    SARS Coronavirus 2 by RT PCR 10/21/2020 NEGATIVE  NEGATIVE Final   Comment: (NOTE) SARS-CoV-2 target nucleic acids are NOT DETECTED.  The SARS-CoV-2 RNA is generally detectable in upper respiratory specimens during the acute phase of infection. The lowest concentration of SARS-CoV-2 viral copies this assay can detect is 138 copies/mL. A negative result does not preclude SARS-Cov-2 infection and should not be used as the sole basis for treatment or other patient management decisions. A negative result may occur with  improper specimen collection/handling, submission of specimen other than nasopharyngeal swab, presence of viral mutation(s) within the areas targeted by this  assay, and inadequate number of viral copies(<138 copies/mL). A negative result must be combined with clinical observations, patient history, and epidemiological information. The expected result is Negative.  Fact Sheet for  Patients:  BloggerCourse.com  Fact Sheet for Healthcare Providers:  SeriousBroker.it  This test is no                          t yet approved or cleared by the [place] FDA and  has been authorized for detection and/or diagnosis of SARS-CoV-2 by FDA under an Emergency Use Authorization (EUA). This EUA will remain  in effect (meaning this test can be used) for the duration of the COVID-19 declaration under Section 564(b)(1) of the Act, 21 U.S.C.section 360bbb-3(b)(1), unless the authorization is terminated  or revoked sooner.       Influenza A by PCR 10/21/2020 NEGATIVE  NEGATIVE Final   Influenza B by PCR 10/21/2020 NEGATIVE  NEGATIVE Final   Comment: (NOTE) The Xpert Xpress SARS-CoV-2/FLU/RSV plus assay is intended as an aid in the diagnosis of influenza from Nasopharyngeal swab specimens and should not be used as a sole basis for treatment. Nasal washings and aspirates are unacceptable for Xpert Xpress SARS-CoV-2/FLU/RSV testing.  Fact Sheet for Patients: BloggerCourse.com  Fact Sheet for Healthcare Providers: SeriousBroker.it  This test is not yet approved or cleared by the [place] FDA and has been authorized for detection and/or diagnosis of SARS-CoV-2 by FDA under an Emergency Use Authorization (EUA). This EUA will remain in effect (meaning this test can be used) for the duration of the COVID-19 declaration under Section 564(b)(1) of the Act, 21 U.S.C. section 360bbb-3(b)(1), unless the authorization is terminated or revoked.  Performed at [facility], [address] [place], [place] [address]   Admission on 10/07/2020, Discharged on 10/14/2020  Component Date Value Ref Range Status   Cholesterol 10/11/2020 150  0 - 200 mg/dL Final   Triglycerides 19/14/7829 173 (A) <150 mg/dL Final   HDL 56/21/3086 35 (A) >40 mg/dL Final   Total CHOL/HDL Ratio  10/11/2020 4.3  RATIO Final   VLDL 10/11/2020 35  0 - 40 mg/dL Final   LDL Cholesterol 10/11/2020 80  0 - 99 mg/dL Final   Comment:        Total Cholesterol/HDL:CHD Risk Coronary Heart Disease Risk Table                     Men   Women  1/2 Average Risk   3.4   3.3  Average Risk       5.0   4.4  2 X Average Risk   9.6   7.1  3 X Average Risk  23.4   11.0        Use the calculated Patient Ratio above and the CHD Risk Table to determine the patient's CHD Risk.        ATP III CLASSIFICATION (LDL):  <100     mg/dL   Optimal  578-469  mg/dL   Near or Above                    Optimal  130-159  mg/dL   Borderline  629-528  mg/dL   High  >413     mg/dL   Very High Performed at [facility], [address], [place], [place] [address]    Hgb A1c MFr Bld 10/11/2020 5.8 (A) 4.8 - 5.6 % Final  Comment: (NOTE)         Prediabetes: 5.7 - 6.4         Diabetes: >6.4         Glycemic control for adults with diabetes: <7.0    Mean Plasma Glucose 10/11/2020 120  mg/dL Final   Comment: (NOTE) Performed At: [facility] [address] [place], [place] [id number] Jolene Schimke MD [id number]    TSH 10/11/2020 1.096  0.350 - 4.500 uIU/mL Final   Comment: Performed by a 3rd Generation assay with a functional sensitivity of <=0.01 uIU/mL. Performed at [facility], [address], [place], [place] [address]   Admission on 10/06/2020, Discharged on 10/07/2020  Component Date Value Ref Range Status   WBC 10/06/2020 6.7  4.0 - 10.5 K/uL Final   RBC 10/06/2020 4.47  4.22 - 5.81 MIL/uL Final   Hemoglobin 10/06/2020 13.6  13.0 - 17.0 g/dL Final   HCT 66/09/3014 42.3  39.0 - 52.0 % Final   MCV 10/06/2020 94.6  80.0 - 100.0 fL Final   MCH 10/06/2020 30.4  26.0 - 34.0 pg Final   MCHC 10/06/2020 32.2  30.0 - 36.0 g/dL Final   RDW 05/04/3233 12.7  11.5 - 15.5 % Final   Platelets 10/06/2020 169  150 - 400 K/uL Final   nRBC 10/06/2020 0.0  0.0 - 0.2 %  Final   Performed at [facility], [address] [place], [place] [address]   Sodium 10/06/2020 139  135 - 145 mmol/L Final   Potassium 10/06/2020 3.3 (A) 3.5 - 5.1 mmol/L Final   Chloride 10/06/2020 105  98 - 111 mmol/L Final   CO2 10/06/2020 28  22 - 32 mmol/L Final   Glucose, Bld 10/06/2020 114 (A) 70 - 99 mg/dL Final   Glucose reference range applies only to samples taken after fasting for at least 8 hours.   BUN 10/06/2020 18  6 - 20 mg/dL Final   Creatinine, Ser 10/06/2020 1.00  0.61 - 1.24 mg/dL Final   Calcium 02/54/2706 8.9  8.9 - 10.3 mg/dL Final   Total Protein 23/76/2831 6.0 (A) 6.5 - 8.1 g/dL Final   Albumin 51/76/1607 3.4 (A) 3.5 - 5.0 g/dL Final   AST 37/01/6268 19  15 - 41 U/L Final   ALT 10/06/2020 25  0 - 44 U/L Final   Alkaline Phosphatase 10/06/2020 51  38 - 126 U/L Final   Total Bilirubin 10/06/2020 0.2 (A) 0.3 - 1.2 mg/dL Final   GFR, Estimated 10/06/2020 >60  >60 mL/min Final   Comment: (NOTE) Calculated using the CKD-EPI Creatinine Equation (2021)    Anion gap 10/06/2020 6  5 - 15 Final   Performed at [facility], [address], [place], [place] [address]   Alcohol, Ethyl (B) 10/06/2020 <10  <10 mg/dL Final   Comment: (NOTE) Lowest detectable limit for serum alcohol is 10 mg/dL.  For medical purposes only. Performed at [facility], [address] [place], [place] [address]    Opiates 10/06/2020 NONE DETECTED  NONE DETECTED Final   Cocaine 10/06/2020 NONE DETECTED  NONE DETECTED Final   Benzodiazepines 10/06/2020 NONE DETECTED  NONE DETECTED Final   Amphetamines 10/06/2020 POSITIVE (A) NONE DETECTED Final   Tetrahydrocannabinol 10/06/2020 NONE DETECTED  NONE DETECTED Final   Barbiturates 10/06/2020 NONE DETECTED  NONE DETECTED Final   Comment: (NOTE) DRUG SCREEN FOR MEDICAL PURPOSES ONLY.  IF CONFIRMATION IS NEEDED FOR ANY PURPOSE, NOTIFY LAB WITHIN 5 DAYS.  LOWEST DETECTABLE LIMITS FOR URINE DRUG SCREEN Drug Class                      Cutoff (ng/mL) Amphetamine and metabolites    1000 Barbiturate and metabolites    200 Benzodiazepine                 200 Tricyclics and metabolites     300 Opiates and metabolites        300 Cocaine and metabolites        300 THC                            50 Performed at [facility], [address] [place], [place] [address]    Salicylate Lvl 10/06/2020 <7.0 (A) 7.0 - 30.0 mg/dL Final   Performed at [facility], [address] [place], [place] [address]   Acetaminophen (Tylenol), Serum 10/06/2020 <10 (A) 10 - 30 ug/mL Final   Performed at [facility], [address], [place], [place] [address]   SARS Coronavirus 2 by RT PCR 10/07/2020 NEGATIVE  NEGATIVE Final   Comment: (NOTE) SARS-CoV-2 target nucleic acids are NOT DETECTED.  The SARS-CoV-2 RNA is generally detectable in upper respiratory specimens during the acute phase of infection. The lowest concentration of SARS-CoV-2 viral copies this assay can detect is 138 copies/mL. A negative result does not preclude SARS-Cov-2 infection and should not be used as the sole basis for treatment or other patient management decisions. A negative result may occur with  improper specimen collection/handling, submission of specimen other than nasopharyngeal swab, presence of viral mutation(s) within the areas targeted by this assay, and inadequate number of viral copies(<138 copies/mL). A negative result must be combined with clinical observations, patient history, and epidemiological information. The expected result is Negative.  Fact Sheet for Patients:  BloggerCourse.com  Fact Sheet for Healthcare Providers:  SeriousBroker.it  This test is no                          t yet approved or cleared by the [place] FDA and  has been authorized for detection and/or diagnosis of SARS-CoV-2 by FDA under an Emergency Use Authorization (EUA). This EUA will remain   in effect (meaning this test can be used) for the duration of the COVID-19 declaration under Section 564(b)(1) of the Act, 21 U.S.C.section 360bbb-3(b)(1), unless the authorization is terminated  or revoked sooner.       Influenza A by PCR 10/07/2020 NEGATIVE  NEGATIVE Final   Influenza B by PCR 10/07/2020 NEGATIVE  NEGATIVE Final   Comment: (NOTE) The Xpert Xpress SARS-CoV-2/FLU/RSV plus assay is intended as an aid in the diagnosis of influenza from Nasopharyngeal swab specimens and should not be used as a sole basis for treatment. Nasal washings and aspirates are unacceptable for Xpert Xpress SARS-CoV-2/FLU/RSV testing.  Fact Sheet for Patients: BloggerCourse.com  Fact Sheet for Healthcare Providers: SeriousBroker.it  This test is not yet approved or cleared by the [place] FDA and has been authorized for detection and/or diagnosis of SARS-CoV-2 by FDA under an Emergency Use Authorization (EUA). This EUA will remain in effect (meaning this test can be used) for the duration of the COVID-19 declaration under Section 564(b)(1) of the Act, 21 U.S.C. section 360bbb-3(b)(1), unless the authorization is terminated or revoked.  Performed at [facility], [address], [place], [place] [address]   Admission on 08/19/2020, Discharged on 08/20/2020  Component Date Value Ref Range Status   Sodium 08/19/2020 140  135 - 145 mmol/L Final   Potassium 08/19/2020 4.3  3.5 - 5.1 mmol/L Final   Chloride 08/19/2020 105  98 - 111 mmol/L Final   CO2 08/19/2020 27  22 - 32 mmol/L Final   Glucose, Bld 08/19/2020 95  70 - 99 mg/dL Final   Glucose reference range applies only to samples taken after fasting for at least 8 hours.   BUN 08/19/2020 13  6 - 20 mg/dL Final   Creatinine, Ser 08/19/2020 0.94  0.61 - 1.24 mg/dL Final   Calcium 94/17/4081 9.6  8.9 - 10.3 mg/dL Final   Total Protein 44/81/8563 6.7  6.5 - 8.1 g/dL Final    Albumin 14/97/0263 3.9  3.5 - 5.0 g/dL Final   AST 78/58/8502 35  15 - 41 U/L Final   ALT 08/19/2020 61 (A) 0 - 44 U/L Final   Alkaline Phosphatase 08/19/2020 64  38 - 126 U/L Final   Total Bilirubin 08/19/2020 0.8  0.3 - 1.2 mg/dL Final   GFR, Estimated 08/19/2020 >60  >60 mL/min Final   Comment: (NOTE) Calculated using the CKD-EPI Creatinine Equation (2021)    Anion gap 08/19/2020 8  5 - 15 Final   Performed at [facility], [address] [place], [place] [address]   WBC 08/19/2020 8.0  4.0 - 10.5 K/uL Final   RBC 08/19/2020 5.28  4.22 - 5.81 MIL/uL Final   Hemoglobin 08/19/2020 15.9  13.0 - 17.0 g/dL Final   HCT 87/86/7672 49.4  39.0 - 52.0 % Final   MCV 08/19/2020 93.6  80.0 - 100.0 fL Final   MCH 08/19/2020 30.1  26.0 - 34.0 pg Final   MCHC 08/19/2020 32.2  30.0 - 36.0 g/dL Final   RDW 09/47/0962 12.7  11.5 - 15.5 % Final   Platelets 08/19/2020 197  150 - 400 K/uL Final   nRBC 08/19/2020 0.0  0.0 - 0.2 % Final   Neutrophils Relative % 08/19/2020 64  % Final   Neutro Abs 08/19/2020 5.1  1.7 - 7.7 K/uL Final   Lymphocytes Relative 08/19/2020 21  % Final   Lymphs Abs 08/19/2020 1.7  0.7 - 4.0 K/uL Final   Monocytes Relative 08/19/2020 13  % Final   Monocytes Absolute 08/19/2020 1.0  0.1 - 1.0 K/uL Final   Eosinophils Relative 08/19/2020 2  % Final   Eosinophils Absolute 08/19/2020 0.1  0.0 - 0.5 K/uL Final   Basophils Relative 08/19/2020 0  % Final   Basophils Absolute 08/19/2020 0.0  0.0 - 0.1 K/uL Final   Immature Granulocytes 08/19/2020 0  % Final   Abs Immature Granulocytes 08/19/2020 0.03  0.00 - 0.07 K/uL Final   Performed at [facility], [address], [place], [place] [address]   Alcohol, Ethyl (B) 08/19/2020 <10  <10 mg/dL Final   Comment: (NOTE) Lowest detectable limit for serum alcohol is 10 mg/dL.  For medical purposes only. Performed at [facility], [address] [place], [place] [address]    Opiates 08/19/2020 NONE DETECTED  NONE  DETECTED Final   Cocaine 08/19/2020 NONE DETECTED  NONE DETECTED Final   Benzodiazepines 08/19/2020 NONE DETECTED  NONE DETECTED Final   Amphetamines 08/19/2020 NONE DETECTED  NONE DETECTED Final   Tetrahydrocannabinol 08/19/2020 NONE DETECTED  NONE DETECTED Final   Barbiturates 08/19/2020 NONE DETECTED  NONE  DETECTED Final   Comment: (NOTE) DRUG SCREEN FOR MEDICAL PURPOSES ONLY.  IF CONFIRMATION IS NEEDED FOR ANY PURPOSE, NOTIFY LAB WITHIN 5 DAYS.  LOWEST DETECTABLE LIMITS FOR URINE DRUG SCREEN Drug Class                     Cutoff (ng/mL) Amphetamine and metabolites    1000 Barbiturate and metabolites    200 Benzodiazepine                 200 Tricyclics and metabolites     300 Opiates and metabolites        300 Cocaine and metabolites        300 THC                            50 Performed at [facility], [address], [place], [place] [address]    SARS Coronavirus 2 by RT PCR 08/19/2020 NEGATIVE  NEGATIVE Final   Comment: (NOTE) SARS-CoV-2 target nucleic acids are NOT DETECTED.  The SARS-CoV-2 RNA is generally detectable in upper respiratory specimens during the acute phase of infection. The lowest concentration of SARS-CoV-2 viral copies this assay can detect is 138 copies/mL. A negative result does not preclude SARS-Cov-2 infection and should not be used as the sole basis for treatment or other patient management decisions. A negative result may occur with  improper specimen collection/handling, submission of specimen other than nasopharyngeal swab, presence of viral mutation(s) within the areas targeted by this assay, and inadequate number of viral copies(<138 copies/mL). A negative result must be combined with clinical observations, patient history, and epidemiological information. The expected result is Negative.  Fact Sheet for Patients:  BloggerCourse.com  Fact Sheet for Healthcare Providers:   SeriousBroker.it  This test is no                          t yet approved or cleared by the [place] FDA and  has been authorized for detection and/or diagnosis of SARS-CoV-2 by FDA under an Emergency Use Authorization (EUA). This EUA will remain  in effect (meaning this test can be used) for the duration of the COVID-19 declaration under Section 564(b)(1) of the Act, 21 U.S.C.section 360bbb-3(b)(1), unless the authorization is terminated  or revoked sooner.       Influenza A by PCR 08/19/2020 NEGATIVE  NEGATIVE Final   Influenza B by PCR 08/19/2020 NEGATIVE  NEGATIVE Final   Comment: (NOTE) The Xpert Xpress SARS-CoV-2/FLU/RSV plus assay is intended as an aid in the diagnosis of influenza from Nasopharyngeal swab specimens and should not be used as a sole basis for treatment. Nasal washings and aspirates are unacceptable for Xpert Xpress SARS-CoV-2/FLU/RSV testing.  Fact Sheet for Patients: BloggerCourse.com  Fact Sheet for Healthcare Providers: SeriousBroker.it  This test is not yet approved or cleared by the [place] FDA and has been authorized for detection and/or diagnosis of SARS-CoV-2 by FDA under an Emergency Use Authorization (EUA). This EUA will remain in effect (meaning this test can be used) for the duration of the COVID-19 declaration under Section 564(b)(1) of the Act, 21 U.S.C. section 360bbb-3(b)(1), unless the authorization is terminated or revoked.  Performed at [facility], [address] [place], [place] [address]     Blood Alcohol level:  Lab Results  Component Value Date   ETH <10 11/13/2020   ETH <10 10/21/2020  Metabolic Disorder Labs: Lab Results  Component Value Date   HGBA1C 5.8 (H) 10/11/2020   MPG 120 10/11/2020   No results found for: PROLACTIN Lab Results  Component Value Date   CHOL 150 10/11/2020   TRIG 173 (H) 10/11/2020   HDL 35  (L) 10/11/2020   CHOLHDL 4.3 10/11/2020   VLDL 35 10/11/2020   LDLCALC 80 10/11/2020    Therapeutic Lab Levels: No results found for: LITHIUM No results found for: VALPROATE No components found for:  CBMZ  Physical Findings   AIMS    Flowsheet Row [facility]  AIMS Total Score 0      AUDIT    Flowsheet Row [facility]  Alcohol Use Disorder Identification Test Final Score (AUDIT) 0      Flowsheet Row ED from 11/14/2020 in [facility] ED from 11/13/2020 in [facility] ED from 11/08/2020 in [facility] RISK CATEGORY High Risk High Risk Error: Question 6 not populated        Musculoskeletal  Strength & Muscle Tone: within normal limits Gait & Station: normal Patient leans: N/A  Psychiatric Specialty Exam  Presentation  General Appearance: Disheveled  Eye Contact:Good  Speech:Clear and Coherent  Speech Volume:Normal  Handedness:Right   Mood and Affect  Mood:Depressed; Anxious  Affect:Congruent   Thought Process  Thought Processes:Coherent  Descriptions of Associations:Intact  Orientation:Full (Time, Place and Person)  Thought Content:WDL  Diagnosis of Schizophrenia or Schizoaffective disorder in past: No  Duration of Psychotic Symptoms: Less than six months   Hallucinations:Hallucinations: None  Ideas of Reference:None  Suicidal Thoughts:Suicidal Thoughts: No SI Active Intent and/or Plan: Without Intent; Without Plan  Homicidal Thoughts:Homicidal Thoughts: No   Sensorium  Memory:Immediate Fair; Recent Fair; Remote Fair  Judgment:Fair  Insight:Good   Executive Functions  Concentration:Fair  Attention Span:Fair  Recall:Fair  Fund of Knowledge:Good  Language:Fair   Psychomotor  Activity  Psychomotor Activity:Psychomotor Activity: Increased; Restlessness; Tremor   Assets  Assets:Desire for Improvement; Communication Skills; Talents/Skills; Resilience   Sleep  Sleep:Sleep: Fair Number of Hours of Sleep: 6   Nutritional Assessment (For OBS and FBC admissions only) Has the patient had a weight loss or gain of 10 pounds or more in the last 3 months?: No Has the patient had a decrease in food intake/or appetite?: No Does the patient have dental problems?: No Does the patient have eating habits or behaviors that may be indicators of an eating disorder including binging or inducing vomiting?: No Has the patient recently lost weight without trying?: No Has the patient been eating poorly because of a decreased appetite?: No Malnutrition Screening Tool Score: 0    Physical Exam  Physical Exam Vitals and nursing note reviewed. Constitutional:      General: He is not in acute distress.    Appearance: Normal appearance. He is not ill-appearing, toxic-appearing or diaphoretic. HENT:    Head: Normocephalic and atraumatic. Pulmonary:    Effort: Pulmonary effort is normal. Neurological:    General: No focal deficit present.    Mental Status: He is alert and oriented to person, place, and time.    Review of Systems Constitutional:  Negative for chills and fever. Respiratory:  Negative for cough and shortness of breath.   Cardiovascular:  Negative for chest pain. Gastrointestinal:  Negative for abdominal pain, nausea and vomiting. Musculoskeletal:  Negative for myalgias. Neurological:  Positive for tremors. Negative for dizziness, tingling and headaches. Psychiatric/Behavioral:  Positive for depression and substance abuse. Negative for hallucinations, memory loss and suicidal ideas. The patient is nervous/anxious. The patient does not have insomnia.   Blood pressure 130/87, pulse (!) 55, temperature 98.3 F (36.8 C), temperature source Oral, resp. rate 16, SpO2  100 %. There is no height or weight on file to calculate BMI. Blood pressure 130/87, pulse (!) 55, temperature 98.3 F (36.8 C), temperature source Oral, resp. rate 16, SpO2 100 %. There is no height or weight on file to calculate BMI.  Treatment Plan Summary: Daily contact with patient to assess and evaluate symptoms and progress in treatment Patient denies active SI but cannot contract for safety at this point.  Patient is interested in going to outpatient substance abuse program at [place] [place]. Pt gave Phone number of person Gwendolyn Lima( Marlena [phone])who confirmed that Pt can go to [facility] for  outpatient substance abuse rehab. Social worker contacted the phone number given by patient and left a message but did not receive a call back. -Patient will be reassessed tomorrow morning to decide appropriate level of care. -Start Remeron 15 mg at bedtime.  -Start hydroxyzine 25 mg 3 times daily as needed for anxiety and trazodone 50 mg as needed nightly for sleep. -CSW to confirm tomorrow morning with Gwendolyn LimaMarlena [phone] to  confirm if Pt can go to [place] for outpatient substance abuse rehab.  Karsten RoVandana  Malyia Moro, MD 11/14/2020 5:51 PM

## 2020-11-14 NOTE — ED Notes (Addendum)
Skin check: Pt has tattoos  L. Scapula L upper and lower arm, R. 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

## 2020-11-14 NOTE — Progress Notes (Signed)
CSW called the [facility] center at request of Dr. Leone Haven to confirm if patient has been accepted to the outpatient program.    CSW spoke with Judeth Cornfield who reports that the patient has NOT been accepted to the outpatient program.  She reports that the outpatient program is not currenlty accepting referrals.  CSW spoke with staff in inpatient who report that patient has NOT been accepted to their program either.    Neither program were familiar with the patient.   CSW has informed Dr. Leone Haven.  Penni Homans, MSW, LCSW 11/14/2020 12:07 PM

## 2020-11-14 NOTE — ED Notes (Signed)
Pt declined breakfast.

## 2020-11-14 NOTE — ED Provider Notes (Signed)
Behavioral Health Admission H&P [facility](FBC & OBS)  Date: 11/14/20 Patient Name: Tristan Valdez MRN: [id number] Chief Complaint:  Chief Complaint  Patient presents with   Suicidal      Diagnoses:  Final diagnoses:  Substance induced mood disorder (HCC)  Suicidal ideation  MDD (major depressive disorder), recurrent severe, without psychosis (HCC)    HPI: Tristan JunesJohn Beckley is a 49y/o male with psychiatric history of depression, anxiety, suicidal attempts, and polysubstance use disorder. Patient presented voluntarily to [facility] for evaluation of depression and suicidal ideation. [facility] with follow up by psychiatry.   Patient assessed face to face and his chart was reviewed by this NP. Patient is alert and oriented X4, he is calm and cooperative. Patient's speech is clear and coherent, his mood is depressed and affect is congruent with mood. His thought process is relevant. There is no indication that he is currently responding to any internal or external stimuli or experiencing delusional thought content. He denies HI, AVH, and paranoia. He denies all medical complaint.   Patient reports that he is unable to maintain safety at this time. He is endorsing suicidal ideation with plan to overdose on Fentanyl. He admits to prior suicidal attempts via cutting at the age of 50 and overdosing at the age of 50. He reports that his current suicidal ideation was triggered by homelessness and drug abuse. He report that he typically doesn't use fentanyl but used Fentanyl and methamphetamine a day prior to admission because he was trying to kill himself. He says that he would like to go to substances abuse program in [place] abuse treatment.   PHQ 2-9:   Flowsheet Row ED [facility]  C-SSRS RISK CATEGORY High Risk High Risk Error: Question 6 not populated        Total Time spent with patient: 20 minutes  Musculoskeletal  Strength & Muscle Tone: within normal limits Gait & Station: normal Patient leans: Right  Psychiatric Specialty Exam  Presentation General Appearance: Disheveled  Eye Contact:Good  Speech:Clear and Coherent  Speech Volume:Normal  Handedness:Right   Mood and Affect  Mood:Depressed  Affect:Congruent   Thought Process  Thought Processes:Coherent  Descriptions of Associations:Intact  Orientation:Full (Time, Place and Person)  Thought Content:WDL  Diagnosis of Schizophrenia or Schizoaffective disorder in past: No  Duration of Psychotic Symptoms: Less than six months  Hallucinations:Hallucinations: None  Ideas of Reference:None  Suicidal Thoughts:Suicidal Thoughts: Yes, Active SI Active Intent and/or Plan: With Plan  Homicidal Thoughts:Homicidal Thoughts: No   Sensorium  Memory:Immediate Good; Recent Good; Remote Good  Judgment:Fair  Insight:Good   Executive Functions  Concentration:Good  Attention Span:Good  Recall:Good  Fund of Knowledge:Good  Language:Good   Psychomotor Activity  Psychomotor Activity:Psychomotor Activity: Normal   Assets  Assets:Desire for Improvement; Manufacturing systems engineerCommunication Skills; Physical Health   Sleep  Sleep:Sleep: Fair Number of Hours of Sleep: 6   Nutritional Assessment (For OBS and FBC admissions only) Has the patient had a weight loss or gain of 10 pounds or more in the last 3 months?: No Has the patient had a decrease in food intake/or appetite?: No Does the patient have dental problems?: No Does the patient have eating habits or behaviors that may be indicators of an eating disorder including binging or inducing vomiting?: No Has the patient recently lost weight without  trying?: No Has the patient been eating poorly because of a decreased appetite?:  No Malnutrition Screening Tool Score: 0   Physical Exam Vitals and nursing note reviewed.  Constitutional:      General: He is not in acute distress.    Appearance: He is well-developed. He is not ill-appearing, toxic-appearing or diaphoretic.  HENT:     Head: Normocephalic and atraumatic.  Eyes:     Conjunctiva/sclera: Conjunctivae normal.  Cardiovascular:     Rate and Rhythm: Normal rate.  Pulmonary:     Effort: Pulmonary effort is normal. No respiratory distress.     Breath sounds: Normal breath sounds. No wheezing.  Abdominal:     Palpations: Abdomen is soft.     Tenderness: There is no abdominal tenderness.  Musculoskeletal:        General: Normal range of motion.     Cervical back: Neck supple.  Skin:    General: Skin is warm.  Neurological:     Mental Status: He is alert and oriented to person, place, and time.   Review of Systems  Constitutional: Negative.   HENT: Negative.    Respiratory: Negative.    Cardiovascular: Negative.   Gastrointestinal: Negative.   Genitourinary: Negative.   Musculoskeletal: Negative.   Skin: Negative.   Neurological: Negative.   Endo/Heme/Allergies: Negative.   Psychiatric/Behavioral:  Positive for depression, substance abuse and suicidal ideas. Negative for hallucinations and memory loss. The patient is not nervous/anxious and does not have insomnia.    Blood pressure (!) 142/80, pulse (!) 57, temperature 98.1 F (36.7 C), temperature source Oral, resp. rate 18, SpO2 98 %. There is no height or weight on file to calculate BMI.  Past Psychiatric History: depression, anxiety, suicidal attempts, and polysubstance use disorder   Is the patient at risk to self? Yes  Has the patient been a risk to self in the past 6 months? Yes .    Has the patient been a risk to self within the distant past? Yes   Is the patient a risk to others? No   Has the patient been a risk to others in the past 6 months? No   Has the patient been a risk to  others within the distant past? No   Past Medical History:  Past Medical History:  Diagnosis Date   Arthritis    Bilateral club feet    Hypertension     Past Surgical History:  Procedure Laterality Date   APPENDECTOMY     reconstruction of tendons of left arm Left     Family History:  Family History  Problem Relation Age of Onset   CAD Mother    CAD Father     Social History:  Social History   Socioeconomic History   Marital status: Divorced    Spouse name: Not on file   Number of children: Not on file   Years of education: Not on file   Highest education level: Not on file  Occupational History   Not on file  Tobacco Use   Smoking status: Every Day    Packs/day: 1.00    Types: Cigarettes   Smokeless tobacco: Never   Tobacco comments:    Last used: 2 days ago  Vaping Use   Vaping Use: Some days  Substance and Sexual Activity   Alcohol use: Never   Drug use: Not Currently    Types: Methamphetamines    Comment: Fentanyl   Sexual activity: Not Currently  Other Topics Concern  Not on file  Social History Narrative   Not on file   Social Determinants of Health   Financial Resource Strain: Not on file  Food Insecurity: Not on file  Transportation Needs: Not on file  Physical Activity: Not on file  Stress: Not on file  Social Connections: Not on file  Intimate Partner Violence: Not on file    SDOH:  SDOH Screenings   Alcohol Screen: Low Risk    Last Alcohol Screening Score (AUDIT): 0  Depression (PHQ2-9): Not on file  Financial Resource Strain: Not on file  Food Insecurity: Not on file  Housing: Not on file  Physical Activity: Not on file  Social Connections: Not on file  Stress: Not on file  Tobacco Use: High Risk   Smoking Tobacco Use: Every Day   Smokeless Tobacco Use: Never  Transportation Needs: Not on file    Last Labs:  Admission on 11/13/2020, Discharged on 11/14/2020  Component Date Value Ref Range Status   SARS Coronavirus 2 by  RT PCR 11/13/2020 NEGATIVE  NEGATIVE Final   Comment: (NOTE) SARS-CoV-2 target nucleic acids are NOT DETECTED.  The SARS-CoV-2 RNA is generally detectable in upper respiratory specimens during the acute phase of infection. The lowest concentration of SARS-CoV-2 viral copies this assay can detect is 138 copies/mL. A negative result does not preclude SARS-Cov-2 infection and should not be used as the sole basis for treatment or other patient management decisions. A negative result may occur with  improper specimen collection/handling, submission of specimen other than nasopharyngeal swab, presence of viral mutation(s) within the areas targeted by this assay, and inadequate number of viral copies(<138 copies/mL). A negative result must be combined with clinical observations, patient history, and epidemiological information. The expected result is Negative.  Fact Sheet for Patients:  BloggerCourse.com  Fact Sheet for Healthcare Providers:  SeriousBroker.it  This test is no                          t yet approved or cleared by the [place] FDA and  has been authorized for detection and/or diagnosis of SARS-CoV-2 by FDA under an Emergency Use Authorization (EUA). This EUA will remain  in effect (meaning this test can be used) for the duration of the COVID-19 declaration under Section 564(b)(1) of the Act, 21 U.S.C.section 360bbb-3(b)(1), unless the authorization is terminated  or revoked sooner.       Influenza A by PCR 11/13/2020 NEGATIVE  NEGATIVE Final   Influenza B by PCR 11/13/2020 NEGATIVE  NEGATIVE Final   Comment: (NOTE) The Xpert Xpress SARS-CoV-2/FLU/RSV plus assay is intended as an aid in the diagnosis of influenza from Nasopharyngeal swab specimens and should not be used as a sole basis for treatment. Nasal washings and aspirates are unacceptable for Xpert Xpress SARS-CoV-2/FLU/RSV testing.  Fact Sheet for  Patients: BloggerCourse.com  Fact Sheet for Healthcare Providers: SeriousBroker.it  This test is not yet approved or cleared by the [place] FDA and has been authorized for detection and/or diagnosis of SARS-CoV-2 by FDA under an Emergency Use Authorization (EUA). This EUA will remain in effect (meaning this test can be used) for the duration of the COVID-19 declaration under Section 564(b)(1) of the Act, 21 U.S.C. section 360bbb-3(b)(1), unless the authorization is terminated or revoked.  Performed at [facility], [address], [place], [place] [address]    Sodium 11/13/2020 136  135 - 145 mmol/L Final   Potassium 11/13/2020 4.2  3.5 - 5.1 mmol/L Final   Chloride 11/13/2020 103  98 - 111 mmol/L Final   CO2 11/13/2020 27  22 - 32 mmol/L Final   Glucose, Bld 11/13/2020 92  70 - 99 mg/dL Final   Glucose reference range applies only to samples taken after fasting for at least 8 hours.   BUN 11/13/2020 14  6 - 20 mg/dL Final   Creatinine, Ser 11/13/2020 0.85  0.61 - 1.24 mg/dL Final   Calcium 13/11/6576 8.8 (A) 8.9 - 10.3 mg/dL Final   Total Protein 46/96/2952 6.3 (A) 6.5 - 8.1 g/dL Final   Albumin 84/13/2440 3.6  3.5 - 5.0 g/dL Final   AST 02/20/2535 23  15 - 41 U/L Final   ALT 11/13/2020 40  0 - 44 U/L Final   Alkaline Phosphatase 11/13/2020 49  38 - 126 U/L Final   Total Bilirubin 11/13/2020 1.0  0.3 - 1.2 mg/dL Final   GFR, Estimated 11/13/2020 >60  >60 mL/min Final   Comment: (NOTE) Calculated using the CKD-EPI Creatinine Equation (2021)    Anion gap 11/13/2020 6  5 - 15 Final   Performed at [facility], [address] [place], [place] [address]   Alcohol, Ethyl (B) 11/13/2020 <10  <10 mg/dL Final   Comment: (NOTE) Lowest detectable limit for serum alcohol is 10 mg/dL.  For medical purposes only. Performed at [facility], [address] [place], [place] [address]    Opiates 11/13/2020 NONE  DETECTED  NONE DETECTED Final   Cocaine 11/13/2020 NONE DETECTED  NONE DETECTED Final   Benzodiazepines 11/13/2020 NONE DETECTED  NONE DETECTED Final   Amphetamines 11/13/2020 POSITIVE (A) NONE DETECTED Final   Tetrahydrocannabinol 11/13/2020 NONE DETECTED  NONE DETECTED Final   Barbiturates 11/13/2020 NONE DETECTED  NONE DETECTED Final   Comment: (NOTE) DRUG SCREEN FOR MEDICAL PURPOSES ONLY.  IF CONFIRMATION IS NEEDED FOR ANY PURPOSE, NOTIFY LAB WITHIN 5 DAYS.  LOWEST DETECTABLE LIMITS FOR URINE DRUG SCREEN Drug Class                     Cutoff (ng/mL) Amphetamine and metabolites    1000 Barbiturate and metabolites    200 Benzodiazepine                 200 Tricyclics and metabolites     300 Opiates and metabolites        300 Cocaine and metabolites        300 THC                            50 Performed at [facility], [address], [place], [place] [address]    WBC 11/13/2020 6.0  4.0 - 10.5 K/uL Final   RBC 11/13/2020 4.79  4.22 - 5.81 MIL/uL Final   Hemoglobin 11/13/2020 14.6  13.0 - 17.0 g/dL Final   HCT 75/64/3329 45.0  39.0 - 52.0 % Final   MCV 11/13/2020 93.9  80.0 - 100.0 fL Final   MCH 11/13/2020 30.5  26.0 - 34.0 pg Final   MCHC 11/13/2020 32.4  30.0 - 36.0 g/dL Final   RDW 51/88/4166 13.5  11.5 - 15.5 % Final   Platelets 11/13/2020 181  150 - 400 K/uL Final   nRBC 11/13/2020 0.0  0.0 - 0.2 % Final   Neutrophils Relative % 11/13/2020 53  % Final   Neutro Abs 11/13/2020 3.2  1.7 - 7.7  K/uL Final   Lymphocytes Relative 11/13/2020 30  % Final   Lymphs Abs 11/13/2020 1.8  0.7 - 4.0 K/uL Final   Monocytes Relative 11/13/2020 14  % Final   Monocytes Absolute 11/13/2020 0.8  0.1 - 1.0 K/uL Final   Eosinophils Relative 11/13/2020 3  % Final   Eosinophils Absolute 11/13/2020 0.2  0.0 - 0.5 K/uL Final   Basophils Relative 11/13/2020 0  % Final   Basophils Absolute 11/13/2020 0.0  0.0 - 0.1 K/uL Final   Immature Granulocytes 11/13/2020 0  % Final   Abs Immature  Granulocytes 11/13/2020 0.01  0.00 - 0.07 K/uL Final   Performed at [facility], [address], [place], [place] [address]   Acetaminophen (Tylenol), Serum 11/13/2020 <10 (A) 10 - 30 ug/mL Final   Comment: (NOTE) Therapeutic concentrations vary significantly. A range of 10-30 ug/mL  may be an effective concentration for many patients. However, some  are best treated at concentrations outside of this range. Acetaminophen concentrations >150 ug/mL at 4 hours after ingestion  and >50 ug/mL at 12 hours after ingestion are often associated with  toxic reactions.  Performed at [facility], [address], [place], [place] [address]    Salicylate Lvl 11/13/2020 <7.0 (A) 7.0 - 30.0 mg/dL Final   Performed at [facility], [address] [place], [place] [address]  Admission on 10/21/2020, Discharged on 10/22/2020  Component Date Value Ref Range Status   Sodium 10/21/2020 137  135 - 145 mmol/L Final   Potassium 10/21/2020 3.4 (A) 3.5 - 5.1 mmol/L Final   Chloride 10/21/2020 101  98 - 111 mmol/L Final   CO2 10/21/2020 30  22 - 32 mmol/L Final   Glucose, Bld 10/21/2020 112 (A) 70 - 99 mg/dL Final   Glucose reference range applies only to samples taken after fasting for at least 8 hours.   BUN 10/21/2020 14  6 - 20 mg/dL Final   Creatinine, Ser 10/21/2020 0.99  0.61 - 1.24 mg/dL Final   Calcium 91/47/8295 8.9  8.9 - 10.3 mg/dL Final   Total Protein 62/13/0865 5.9 (A) 6.5 - 8.1 g/dL Final   Albumin 78/46/9629 3.4 (A) 3.5 - 5.0 g/dL Final   AST 52/84/1324 26  15 - 41 U/L Final   ALT 10/21/2020 34  0 - 44 U/L Final   Alkaline Phosphatase 10/21/2020 57  38 - 126 U/L Final   Total Bilirubin 10/21/2020 1.2  0.3 - 1.2 mg/dL Final   GFR, Estimated 10/21/2020 >60  >60 mL/min Final   Comment: (NOTE) Calculated using the CKD-EPI Creatinine Equation (2021)    Anion gap 10/21/2020 6  5 - 15 Final   Performed at [facility], [address], [place], [place] [address]    Alcohol, Ethyl (B) 10/21/2020 <10  <10 mg/dL Final   Comment: (NOTE) Lowest detectable limit for serum alcohol is 10 mg/dL.  For medical purposes only. Performed at [facility], [address], [place], [place] [address]    Salicylate Lvl 10/21/2020 <7.0 (A) 7.0 - 30.0 mg/dL Final   Performed at [facility], [address] [place], [place] [address]   Acetaminophen (Tylenol), Serum 10/21/2020 <10 (A) 10 - 30 ug/mL Final   Comment: (NOTE) Therapeutic concentrations vary significantly. A range of 10-30 ug/mL  may be an effective concentration for many patients. However, some  are best treated at concentrations outside of this range. Acetaminophen concentrations >150 ug/mL at 4  hours after ingestion  and >50 ug/mL at 12 hours after ingestion are often associated with  toxic reactions.  Performed at [facility], [address], [place], [place] [address]    WBC 10/21/2020 8.2  4.0 - 10.5 K/uL Final   RBC 10/21/2020 4.54  4.22 - 5.81 MIL/uL Final   Hemoglobin 10/21/2020 13.9  13.0 - 17.0 g/dL Final   HCT 59/93/5701 42.5  39.0 - 52.0 % Final   MCV 10/21/2020 93.6  80.0 - 100.0 fL Final   MCH 10/21/2020 30.6  26.0 - 34.0 pg Final   MCHC 10/21/2020 32.7  30.0 - 36.0 g/dL Final   RDW 77/93/9030 13.1  11.5 - 15.5 % Final   Platelets 10/21/2020 183  150 - 400 K/uL Final   nRBC 10/21/2020 0.0  0.0 - 0.2 % Final   Performed at [facility], [address] [place], [place] [address]   Opiates 10/21/2020 NONE DETECTED  NONE DETECTED Final   Cocaine 10/21/2020 NONE DETECTED  NONE DETECTED Final   Benzodiazepines 10/21/2020 NONE DETECTED  NONE DETECTED Final   Amphetamines 10/21/2020 POSITIVE (A) NONE DETECTED Final   Tetrahydrocannabinol 10/21/2020 NONE DETECTED  NONE DETECTED Final   Barbiturates 10/21/2020 NONE DETECTED  NONE DETECTED Final   Comment: (NOTE) DRUG SCREEN FOR MEDICAL PURPOSES ONLY.  IF CONFIRMATION IS NEEDED FOR ANY PURPOSE, NOTIFY LAB WITHIN 5  DAYS.  LOWEST DETECTABLE LIMITS FOR URINE DRUG SCREEN Drug Class                     Cutoff (ng/mL) Amphetamine and metabolites    1000 Barbiturate and metabolites    200 Benzodiazepine                 200 Tricyclics and metabolites     300 Opiates and metabolites        300 Cocaine and metabolites        300 THC                            50 Performed at [facility], [address] [place], [place] [address]    SARS Coronavirus 2 by RT PCR 10/21/2020 NEGATIVE  NEGATIVE Final   Comment: (NOTE) SARS-CoV-2 target nucleic acids are NOT DETECTED.  The SARS-CoV-2 RNA is generally detectable in upper respiratory specimens during the acute phase of infection. The lowest concentration of SARS-CoV-2 viral copies this assay can detect is 138 copies/mL. A negative result does not preclude SARS-Cov-2 infection and should not be used as the sole basis for treatment or other patient management decisions. A negative result may occur with  improper specimen collection/handling, submission of specimen other than nasopharyngeal swab, presence of viral mutation(s) within the areas targeted by this assay, and inadequate number of viral copies(<138 copies/mL). A negative result must be combined with clinical observations, patient history, and epidemiological information. The expected result is Negative.  Fact Sheet for Patients:  BloggerCourse.com  Fact Sheet for Healthcare Providers:  SeriousBroker.it  This test is no                          t yet approved or cleared by the [place] FDA and  has been authorized for detection and/or diagnosis of SARS-CoV-2 by FDA under an Emergency Use Authorization (EUA). This EUA will remain  in effect (meaning this test can be used) for  the duration of the COVID-19 declaration under Section 564(b)(1) of the Act, 21 U.S.C.section 360bbb-3(b)(1), unless the authorization is terminated  or revoked  sooner.       Influenza A by PCR 10/21/2020 NEGATIVE  NEGATIVE Final   Influenza B by PCR 10/21/2020 NEGATIVE  NEGATIVE Final   Comment: (NOTE) The Xpert Xpress SARS-CoV-2/FLU/RSV plus assay is intended as an aid in the diagnosis of influenza from Nasopharyngeal swab specimens and should not be used as a sole basis for treatment. Nasal washings and aspirates are unacceptable for Xpert Xpress SARS-CoV-2/FLU/RSV testing.  Fact Sheet for Patients: BloggerCourse.com  Fact Sheet for Healthcare Providers: SeriousBroker.it  This test is not yet approved or cleared by the [place] FDA and has been authorized for detection and/or diagnosis of SARS-CoV-2 by FDA under an Emergency Use Authorization (EUA). This EUA will remain in effect (meaning this test can be used) for the duration of the COVID-19 declaration under Section 564(b)(1) of the Act, 21 U.S.C. section 360bbb-3(b)(1), unless the authorization is terminated or revoked.  Performed at [facility], [address], [place], [place] [address]   Admission on 10/07/2020, Discharged on 10/14/2020  Component Date Value Ref Range Status   Cholesterol 10/11/2020 150  0 - 200 mg/dL Final   Triglycerides 60/45/4098 173 (A) <150 mg/dL Final   HDL 11/91/4782 35 (A) >40 mg/dL Final   Total CHOL/HDL Ratio 10/11/2020 4.3  RATIO Final   VLDL 10/11/2020 35  0 - 40 mg/dL Final   LDL Cholesterol 10/11/2020 80  0 - 99 mg/dL Final   Comment:        Total Cholesterol/HDL:CHD Risk Coronary Heart Disease Risk Table                     Men   Women  1/2 Average Risk   3.4   3.3  Average Risk       5.0   4.4  2 X Average Risk   9.6   7.1  3 X Average Risk  23.4   11.0        Use the calculated Patient Ratio above and the CHD Risk Table to determine the patient's CHD Risk.        ATP III CLASSIFICATION (LDL):  <100     mg/dL   Optimal  956-213  mg/dL   Near or Above                     Optimal  130-159  mg/dL   Borderline  086-578  mg/dL   High  >469     mg/dL   Very High Performed at [facility], [address], [place], [place] [address]    Hgb A1c MFr Bld 10/11/2020 5.8 (A) 4.8 - 5.6 % Final   Comment: (NOTE)         Prediabetes: 5.7 - 6.4         Diabetes: >6.4         Glycemic control for adults with diabetes: <7.0    Mean Plasma Glucose 10/11/2020 120  mg/dL Final   Comment: (NOTE) Performed At: [facility] [address] [place], [place] [id number] Jolene Schimke MD UU:[id number]    TSH 10/11/2020 1.096  0.350 - 4.500 uIU/mL Final   Comment: Performed by a 3rd Generation assay with a functional sensitivity of <=0.01 uIU/mL. Performed at [facility], [address] [place], [place] [address]   Admission on 10/06/2020,  Discharged on 10/07/2020  Component Date Value Ref Range Status   WBC 10/06/2020 6.7  4.0 - 10.5 K/uL Final   RBC 10/06/2020 4.47  4.22 - 5.81 MIL/uL Final   Hemoglobin 10/06/2020 13.6  13.0 - 17.0 g/dL Final   HCT 04/54/0981 42.3  39.0 - 52.0 % Final   MCV 10/06/2020 94.6  80.0 - 100.0 fL Final   MCH 10/06/2020 30.4  26.0 - 34.0 pg Final   MCHC 10/06/2020 32.2  30.0 - 36.0 g/dL Final   RDW 19/14/7829 12.7  11.5 - 15.5 % Final   Platelets 10/06/2020 169  150 - 400 K/uL Final   nRBC 10/06/2020 0.0  0.0 - 0.2 % Final   Performed at [facility], [address], [place], [place] [address]   Sodium 10/06/2020 139  135 - 145 mmol/L Final   Potassium 10/06/2020 3.3 (A) 3.5 - 5.1 mmol/L Final   Chloride 10/06/2020 105  98 - 111 mmol/L Final   CO2 10/06/2020 28  22 - 32 mmol/L Final   Glucose, Bld 10/06/2020 114 (A) 70 - 99 mg/dL Final   Glucose reference range applies only to samples taken after fasting for at least 8 hours.   BUN 10/06/2020 18  6 - 20 mg/dL Final   Creatinine, Ser 10/06/2020 1.00  0.61 - 1.24 mg/dL Final   Calcium 08/65/7846 8.9  8.9 - 10.3 mg/dL Final   Total Protein  10/06/2020 6.0 (A) 6.5 - 8.1 g/dL Final   Albumin 96/29/5284 3.4 (A) 3.5 - 5.0 g/dL Final   AST 13/24/4010 19  15 - 41 U/L Final   ALT 10/06/2020 25  0 - 44 U/L Final   Alkaline Phosphatase 10/06/2020 51  38 - 126 U/L Final   Total Bilirubin 10/06/2020 0.2 (A) 0.3 - 1.2 mg/dL Final   GFR, Estimated 10/06/2020 >60  >60 mL/min Final   Comment: (NOTE) Calculated using the CKD-EPI Creatinine Equation (2021)    Anion gap 10/06/2020 6  5 - 15 Final   Performed at [facility], [address], [place], [place] [address]   Alcohol, Ethyl (B) 10/06/2020 <10  <10 mg/dL Final   Comment: (NOTE) Lowest detectable limit for serum alcohol is 10 mg/dL.  For medical purposes only. Performed at [facility], [address] [place], [place] [address]    Opiates 10/06/2020 NONE DETECTED  NONE DETECTED Final   Cocaine 10/06/2020 NONE DETECTED  NONE DETECTED Final   Benzodiazepines 10/06/2020 NONE DETECTED  NONE DETECTED Final   Amphetamines 10/06/2020 POSITIVE (A) NONE DETECTED Final   Tetrahydrocannabinol 10/06/2020 NONE DETECTED  NONE DETECTED Final   Barbiturates 10/06/2020 NONE DETECTED  NONE DETECTED Final   Comment: (NOTE) DRUG SCREEN FOR MEDICAL PURPOSES ONLY.  IF CONFIRMATION IS NEEDED FOR ANY PURPOSE, NOTIFY LAB WITHIN 5 DAYS.  LOWEST DETECTABLE LIMITS FOR URINE DRUG SCREEN Drug Class                     Cutoff (ng/mL) Amphetamine and metabolites    1000 Barbiturate and metabolites    200 Benzodiazepine                 200 Tricyclics and metabolites     300 Opiates and metabolites        300 Cocaine and metabolites        300 THC  50 Performed at [facility], [address] [place], [place] [address]    Salicylate Lvl 10/06/2020 <7.0 (A) 7.0 - 30.0 mg/dL Final   Performed at [facility], [address], [place], [place] [address]   Acetaminophen (Tylenol), Serum 10/06/2020 <10 (A) 10 - 30 ug/mL Final   Performed at [facility], [address] [place], [place] [address]   SARS Coronavirus 2 by RT PCR 10/07/2020 NEGATIVE  NEGATIVE Final   Comment: (NOTE) SARS-CoV-2 target nucleic acids are NOT DETECTED.  The SARS-CoV-2 RNA is generally detectable in upper respiratory specimens during the acute phase of infection. The lowest concentration of SARS-CoV-2 viral copies this assay can detect is 138 copies/mL. A negative result does not preclude SARS-Cov-2 infection and should not be used as the sole basis for treatment or other patient management decisions. A negative result may occur with  improper specimen collection/handling, submission of specimen other than nasopharyngeal swab, presence of viral mutation(s) within the areas targeted by this assay, and inadequate number of viral copies(<138 copies/mL). A negative result must be combined with clinical observations, patient history, and epidemiological information. The expected result is Negative.  Fact Sheet for Patients:  BloggerCourse.com  Fact Sheet for Healthcare Providers:  SeriousBroker.it  This test is no                          t yet approved or cleared by the [place] FDA and  has been authorized for detection and/or diagnosis of SARS-CoV-2 by FDA under an Emergency Use Authorization (EUA). This EUA will remain  in effect (meaning this test can be used) for the duration of the COVID-19 declaration under Section 564(b)(1) of the Act, 21 U.S.C.section 360bbb-3(b)(1), unless the authorization is terminated  or revoked sooner.       Influenza A by PCR 10/07/2020 NEGATIVE  NEGATIVE Final   Influenza B by PCR 10/07/2020 NEGATIVE  NEGATIVE Final   Comment: (NOTE) The Xpert Xpress SARS-CoV-2/FLU/RSV plus assay is intended as an aid in the diagnosis of influenza from Nasopharyngeal swab specimens and should not be used as a sole basis for treatment. Nasal washings and aspirates are  unacceptable for Xpert Xpress SARS-CoV-2/FLU/RSV testing.  Fact Sheet for Patients: BloggerCourse.com  Fact Sheet for Healthcare Providers: SeriousBroker.it  This test is not yet approved or cleared by the [place] FDA and has been authorized for detection and/or diagnosis of SARS-CoV-2 by FDA under an Emergency Use Authorization (EUA). This EUA will remain in effect (meaning this test can be used) for the duration of the COVID-19 declaration under Section 564(b)(1) of the Act, 21 U.S.C. section 360bbb-3(b)(1), unless the authorization is terminated or revoked.  Performed at [facility], [address] [place], [place] [address]   Admission on 08/19/2020, Discharged on 08/20/2020  Component Date Value Ref Range Status   Sodium 08/19/2020 140  135 - 145 mmol/L Final   Potassium 08/19/2020 4.3  3.5 - 5.1 mmol/L Final   Chloride 08/19/2020 105  98 - 111 mmol/L Final   CO2 08/19/2020 27  22 - 32 mmol/L Final   Glucose, Bld 08/19/2020 95  70 - 99 mg/dL Final   Glucose reference range applies only to samples taken after fasting for at least 8 hours.   BUN 08/19/2020 13  6 - 20 mg/dL Final   Creatinine, Ser 08/19/2020 0.94  0.61 - 1.24 mg/dL Final   Calcium 76/22/6333 9.6  8.9 - 10.3 mg/dL Final   Total Protein 98/02/9146 6.7  6.5 - 8.1 g/dL Final   Albumin 82/95/6213 3.9  3.5 - 5.0 g/dL Final   AST 08/65/7846 35  15 - 41 U/L Final   ALT 08/19/2020 61 (A) 0 - 44 U/L Final   Alkaline Phosphatase 08/19/2020 64  38 - 126 U/L Final   Total Bilirubin 08/19/2020 0.8  0.3 - 1.2 mg/dL Final   GFR, Estimated 08/19/2020 >60  >60 mL/min Final   Comment: (NOTE) Calculated using the CKD-EPI Creatinine Equation (2021)    Anion gap 08/19/2020 8  5 - 15 Final   Performed at [facility], [address] [place], [place] [address]   WBC 08/19/2020 8.0  4.0 - 10.5 K/uL Final   RBC 08/19/2020 5.28  4.22 - 5.81 MIL/uL Final    Hemoglobin 08/19/2020 15.9  13.0 - 17.0 g/dL Final   HCT 28/41/3244 49.4  39.0 - 52.0 % Final   MCV 08/19/2020 93.6  80.0 - 100.0 fL Final   MCH 08/19/2020 30.1  26.0 - 34.0 pg Final   MCHC 08/19/2020 32.2  30.0 - 36.0 g/dL Final   RDW 04/28/7251 12.7  11.5 - 15.5 % Final   Platelets 08/19/2020 197  150 - 400 K/uL Final   nRBC 08/19/2020 0.0  0.0 - 0.2 % Final   Neutrophils Relative % 08/19/2020 64  % Final   Neutro Abs 08/19/2020 5.1  1.7 - 7.7 K/uL Final   Lymphocytes Relative 08/19/2020 21  % Final   Lymphs Abs 08/19/2020 1.7  0.7 - 4.0 K/uL Final   Monocytes Relative 08/19/2020 13  % Final   Monocytes Absolute 08/19/2020 1.0  0.1 - 1.0 K/uL Final   Eosinophils Relative 08/19/2020 2  % Final   Eosinophils Absolute 08/19/2020 0.1  0.0 - 0.5 K/uL Final   Basophils Relative 08/19/2020 0  % Final   Basophils Absolute 08/19/2020 0.0  0.0 - 0.1 K/uL Final   Immature Granulocytes 08/19/2020 0  % Final   Abs Immature Granulocytes 08/19/2020 0.03  0.00 - 0.07 K/uL Final   Performed at [facility], [address], [place], [place] [address]   Alcohol, Ethyl (B) 08/19/2020 <10  <10 mg/dL Final   Comment: (NOTE) Lowest detectable limit for serum alcohol is 10 mg/dL.  For medical purposes only. Performed at [facility], [address] [place], [place] [address]    Opiates 08/19/2020 NONE DETECTED  NONE DETECTED Final   Cocaine 08/19/2020 NONE DETECTED  NONE DETECTED Final   Benzodiazepines 08/19/2020 NONE DETECTED  NONE DETECTED Final   Amphetamines 08/19/2020 NONE DETECTED  NONE DETECTED Final   Tetrahydrocannabinol 08/19/2020 NONE DETECTED  NONE DETECTED Final   Barbiturates 08/19/2020 NONE DETECTED  NONE DETECTED Final   Comment: (NOTE) DRUG SCREEN FOR MEDICAL PURPOSES ONLY.  IF CONFIRMATION IS NEEDED FOR ANY PURPOSE, NOTIFY LAB WITHIN 5 DAYS.  LOWEST DETECTABLE LIMITS FOR URINE DRUG SCREEN Drug Class                     Cutoff (ng/mL) Amphetamine and metabolites     1000 Barbiturate and metabolites    200 Benzodiazepine                 200 Tricyclics and metabolites     300 Opiates and metabolites        300 Cocaine and metabolites        300 THC  50 Performed at [facility], [address] [place], [place] [address]    SARS Coronavirus 2 by RT PCR 08/19/2020 NEGATIVE  NEGATIVE Final   Comment: (NOTE) SARS-CoV-2 target nucleic acids are NOT DETECTED.  The SARS-CoV-2 RNA is generally detectable in upper respiratory specimens during the acute phase of infection. The lowest concentration of SARS-CoV-2 viral copies this assay can detect is 138 copies/mL. A negative result does not preclude SARS-Cov-2 infection and should not be used as the sole basis for treatment or other patient management decisions. A negative result may occur with  improper specimen collection/handling, submission of specimen other than nasopharyngeal swab, presence of viral mutation(s) within the areas targeted by this assay, and inadequate number of viral copies(<138 copies/mL). A negative result must be combined with clinical observations, patient history, and epidemiological information. The expected result is Negative.  Fact Sheet for Patients:  BloggerCourse.com  Fact Sheet for Healthcare Providers:  SeriousBroker.it  This test is no                          t yet approved or cleared by the [place] FDA and  has been authorized for detection and/or diagnosis of SARS-CoV-2 by FDA under an Emergency Use Authorization (EUA). This EUA will remain  in effect (meaning this test can be used) for the duration of the COVID-19 declaration under Section 564(b)(1) of the Act, 21 U.S.C.section 360bbb-3(b)(1), unless the authorization is terminated  or revoked sooner.       Influenza A by PCR 08/19/2020 NEGATIVE  NEGATIVE Final   Influenza B by PCR 08/19/2020 NEGATIVE  NEGATIVE Final    Comment: (NOTE) The Xpert Xpress SARS-CoV-2/FLU/RSV plus assay is intended as an aid in the diagnosis of influenza from Nasopharyngeal swab specimens and should not be used as a sole basis for treatment. Nasal washings and aspirates are unacceptable for Xpert Xpress SARS-CoV-2/FLU/RSV testing.  Fact Sheet for Patients: BloggerCourse.com  Fact Sheet for Healthcare Providers: SeriousBroker.it  This test is not yet approved or cleared by the [place] FDA and has been authorized for detection and/or diagnosis of SARS-CoV-2 by FDA under an Emergency Use Authorization (EUA). This EUA will remain in effect (meaning this test can be used) for the duration of the COVID-19 declaration under Section 564(b)(1) of the Act, 21 U.S.C. section 360bbb-3(b)(1), unless the authorization is terminated or revoked.  Performed at [facility], [address], [place], [place] [address]     Allergies: Patient has no known allergies.  PTA Medications: (Not in a hospital admission)   Medical Decision Making  Patient report that he is unable to contract for safety; patient will be admitted to [facility] for continuous assessment with follow-up by psychiatry on day shift.     Recommendations  Based on my evaluation the patient does not appear to have an emergency medical condition.  Maricela Bo, NP 11/14/20  4:23 AM

## 2020-11-14 NOTE — BH Assessment (Signed)
Per Ann Lions, NP at [facility] patient can come over to [facility] for observation.  Nurse call report to [phone].

## 2020-11-14 NOTE — ED Notes (Signed)
Pt sleeping at present, no distress noted.  Monitoring for safety. 

## 2020-11-14 NOTE — ED Notes (Signed)
Safe Transport called 

## 2020-11-14 NOTE — Progress Notes (Signed)
CSW called several times to the provider in [place], [place] that the patient was supposed to see.  Phone rings but goes to an nondescript voicemail.  CSW left HIPAA compliant voicemail.  [phone] Marlena was the contact information provided   Penni Homans, MSW, LCSW 11/14/2020 3:18 PM

## 2020-11-14 NOTE — ED Notes (Signed)
Pt sleeping in no acute distress. RR even and unlabored. Safety maintained. 

## 2020-11-15 ENCOUNTER — Other Ambulatory Visit: Payer: Self-pay

## 2020-11-15 ENCOUNTER — Emergency Department (HOSPITAL_COMMUNITY)
Admission: EM | Admit: 2020-11-15 | Discharge: 2020-11-16 | Disposition: A | Discharge status: Home / Self Care | Payer: No Typology Code available for payment source | Attending: Emergency Medicine | Admitting: Emergency Medicine

## 2020-11-15 DIAGNOSIS — Z20822 Contact with and (suspected) exposure to covid-19: Secondary | ICD-10-CM | POA: Diagnosis not present

## 2020-11-15 DIAGNOSIS — F1520 Other stimulant dependence, uncomplicated: Secondary | ICD-10-CM | POA: Insufficient documentation

## 2020-11-15 DIAGNOSIS — I10 Essential (primary) hypertension: Secondary | ICD-10-CM | POA: Diagnosis not present

## 2020-11-15 DIAGNOSIS — F17210 Nicotine dependence, cigarettes, uncomplicated: Secondary | ICD-10-CM | POA: Diagnosis not present

## 2020-11-15 DIAGNOSIS — R45851 Suicidal ideations: Secondary | ICD-10-CM | POA: Insufficient documentation

## 2020-11-15 DIAGNOSIS — F32A Depression, unspecified: Secondary | ICD-10-CM | POA: Diagnosis present

## 2020-11-15 DIAGNOSIS — F331 Major depressive disorder, recurrent, moderate: Secondary | ICD-10-CM | POA: Insufficient documentation

## 2020-11-15 DIAGNOSIS — F332 Major depressive disorder, recurrent severe without psychotic features: Secondary | ICD-10-CM | POA: Diagnosis not present

## 2020-11-15 DIAGNOSIS — Z5900 Homelessness unspecified: Secondary | ICD-10-CM | POA: Diagnosis not present

## 2020-11-15 DIAGNOSIS — F1994 Other psychoactive substance use, unspecified with psychoactive substance-induced mood disorder: Secondary | ICD-10-CM | POA: Diagnosis not present

## 2020-11-15 LAB — ETHANOL: Alcohol, Ethyl (B): <10 mg/dL (ref <10)

## 2020-11-15 MED ORDER — HYDROXYZINE HCL 25 MG PO TABS: 25.0000 mg | ORAL_TABLET | Freq: Three times a day (TID) | ORAL | 0 refills | Status: DC | PRN

## 2020-11-15 MED ORDER — NICOTINE 14 MG/24HR TD PT24: 14.0000 mg | MEDICATED_PATCH | Freq: Every day | TRANSDERMAL | 0 refills | Status: DC

## 2020-11-15 MED ORDER — MIRTAZAPINE 15 MG PO TABS: 15.0000 mg | ORAL_TABLET | Freq: Every day | ORAL | 0 refills | Status: DC

## 2020-11-15 NOTE — ED Notes (Signed)
EKG Stored, awaiting order for processing. 

## 2020-11-15 NOTE — ED Provider Notes (Addendum)
[facility]-EMERGENCY DEPT Provider Note   CSN: [id number] Arrival date & time: 11/15/20  2215     History Chief Complaint  Patient presents with   Suicidal    Tristan Valdez is a 50 y.o. male.  50 year old male presents with complaint of feeling suicidal with plan to snort $40 worth of fentanyl (states this is about a gram).  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  Denies denies alcohol use, reports meth use.  Not on any daily medications, no other complaints or concerns, denies auditory or visual hallucinations.      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   Suicidal ideations 10/07/2020   Substance induced mood disorder (HCC) 10/07/2020   Polysubstance abuse (HCC)    TOBACCO DEPENDENCE 06/23/2006   GASTROESOPHAGEAL REFLUX, NO ESOPHAGITIS 06/23/2006   PSORIASIS 06/23/2006    Past Surgical History:  Procedure Laterality Date   APPENDECTOMY     reconstruction of tendons of left arm Left        Family History  Problem Relation Age of Onset   CAD Mother    CAD Father     Social History   Tobacco Use   Smoking status: Every Day    Packs/day: 1.00    Types: Cigarettes   Smokeless tobacco: Never   Tobacco comments:    Last used: 2 days ago  Vaping Use   Vaping Use: Some days  Substance Use Topics   Alcohol use: Never   Drug use: Not Currently    Types: Methamphetamines    Comment: Fentanyl    Home Medications Prior to Admission medications   Medication Sig Start Date End Date Taking? Authorizing Provider  amoxicillin (AMOXIL) 875 MG tablet Take  1 tablet (875 mg total) by mouth 2 (two) times daily for 7 days. 11/08/20 11/15/20  Rhys Martini, PA-C  hydrOXYzine (ATARAX/VISTARIL) 25 MG tablet Take 1 tablet (25 mg total) by mouth 3 (three) times daily as needed for anxiety. 11/15/20   White, Patrice L, NP  mirtazapine (REMERON) 15 MG tablet Take 1 tablet (15 mg total) by mouth at bedtime. 11/15/20   White, Patrice L, NP  nicotine (NICODERM CQ - DOSED IN MG/24 HOURS) 14 mg/24hr patch Place 1 patch (14 mg total) onto the skin daily. 11/15/20   Layla Barter, NP    Allergies    Patient has no known allergies.  Review of Systems   Review of Systems  Constitutional:  Negative for fever.  Gastrointestinal:  Negative for abdominal pain, constipation, diarrhea, nausea and vomiting.  Genitourinary:  Negative for dysuria.  Musculoskeletal:  Negative for arthralgias and myalgias.  Skin:  Negative for rash and wound.  Allergic/Immunologic: Negative for immunocompromised state.  Neurological:  Negative for weakness and headaches.  Hematological:  Negative for adenopathy.  Psychiatric/Behavioral:  Positive for suicidal ideas. Negative for confusion and hallucinations.   All other systems reviewed and are negative.  Physical Exam Updated Vital Signs BP (!) 152/76 (BP Location: Right Arm)   Pulse 70  Temp 98.1 F (36.7 C) (Oral)   Resp 18   SpO2 95%   Physical Exam Vitals and nursing note reviewed.  Constitutional:      General: He is not in acute distress.    Appearance: He is well-developed. He is not diaphoretic.  HENT:     Head: Normocephalic and atraumatic.  Cardiovascular:     Rate and Rhythm: Normal rate and regular rhythm.     Pulses: Normal pulses.     Heart sounds: Normal heart sounds.  Pulmonary:     Effort: Pulmonary effort is normal.     Breath sounds: Normal breath sounds.  Abdominal:     Palpations: Abdomen is soft.     Tenderness: There is no abdominal tenderness.  Musculoskeletal:     Right lower leg: No  edema.     Left lower leg: No edema.  Skin:    General: Skin is warm and dry.     Findings: No erythema or rash.  Neurological:     Mental Status: He is alert and oriented to person, place, and time.  Psychiatric:        Behavior: Behavior normal.    ED Results / Procedures / Treatments   Labs (all labs ordered are listed, but only abnormal results are displayed) Labs Reviewed  COMPREHENSIVE METABOLIC PANEL  ETHANOL  RAPID URINE DRUG SCREEN, HOSP PERFORMED  CBC WITH DIFFERENTIAL/PLATELET    EKG None  Radiology No results found.  Procedures Procedures   Medications Ordered in ED Medications - No data to display  ED Course  I have reviewed the triage vital signs and the nursing notes.  Pertinent labs & imaging results that were available during my care of the patient were reviewed by me and considered in my medical decision making (see chart for details).  Clinical Course as of 11/15/20 2302  Sat Nov 15, 2020  6311 50 year old male returns to the emergency room with suicidal ideation.  Plan is to clear medically and allow for behavioral health dispo. [LM]    Clinical Course User Index [LM] Alden Hipp   MDM Rules/Calculators/A&P                            Final Clinical Impression(s) / ED Diagnoses Final diagnoses:  Suicidal ideation  Homeless    Rx / DC Orders ED Discharge Orders     None        Jeannie Fend, PA-C 11/15/20 2300    Jeannie Fend, PA-C 11/15/20 2302    Linwood Dibbles, MD 11/17/20 1021

## 2020-11-15 NOTE — ED Notes (Signed)
Pt asleep with even and unlabored respirations. No distress or discomfort noted. Pt remains safe on the unit. Will continue to monitor. 

## 2020-11-15 NOTE — Progress Notes (Signed)
CSW contacted Marlena/[phone] at the provider's request in reference to possible placement at a facility in [place], [place]. CSW spoke with Gwendolyn Lima who is a friend of the patient. It was reported that she plans on getting the patient in an outpatient treatment center called [facility] located at [address] [place], [place] [address]/Phone#: [phone]. It shows that the facility is only operational during the week days from 8am-5pm. Gwendolyn Lima advised that she does not have a way for the patient to get to [place], however can pay for a taxi to her residence, if he is able to caught a bus to [place], [place] also advised on plans to possible getting the patient into a inpatient program in [place].  Crissie Reese, MSW, LCSW-A, LCAS-A Phone: [phone] Disposition/TOC

## 2020-11-15 NOTE — Discharge Instructions (Addendum)
Follow up with stated plan: [facility] in [place].  Homeless Shelter List:   [facility]) [address] [place], [place] Phone: [phone]   [facility] has been providing emergency shelter to those in need of a permanent residence for over 35 years. The [employer] shelter plays an important role in our community.    There are many life events that can pull someone into a downward spiral towards poverty that is very difficult to get out of. Homelessness is a problem that can affect anyone of [place]. [employer] is a safe and comforting place to stay, especially if you have experienced the hardship of street life.    [employer] provides a single bed and bedding to 100 adult men and women. The shelter welcomes all who are in need of housing, no one in real need is turned away unless space is not available.    While staying at [facility], guests are offered more than just a bed for a night. Hot meals are provided and every guest has access to case management services. Case managers provide assistance with finding housing, employment, or other services that will help them gain stability. Continuous stay is based on availability, capacity, and progress towards goals.    To contact the front desk of [facility] please call   [phone] ext [phone] or ext. [phone].   [facility] [address] [place], [place] [address] Phone: [phone]   [facility] (Women only) [address] [place], [place] [address] Phone: [phone]   [facility] [address], [place] [address] Phone: [phone]   [facility]: [phone]. [address] [place], [place] [address] Phone: [phone]   [facility] [address] [place], [place] [address] Check in at 6:00PM for placement at a local  shelter) Phone: [phone]   [facility]idential - Admissions are currently completed Monday through Friday at 8am; both appointments and walk-ins are accepted.  Any individual that is a [facility] resident may present for a substance abuse screening and assessment for admission.  A person may be referred by numerous sources or self-refer.   Potential clients will be screened for medical necessity and appropriateness for the program.  Clients must meet criteria for high-intensity residential treatment services.  If clinically appropriate, a client will continue with the comprehensive clinical assessment and intake process, as well as enrollment in the [facility].  Address: [address] [place], [place] [address] Admin Hours: Mon-Fri 8AM to [facility] Center Hours: 24/7 Phone: [phone] Fax: [phone]  [facility] Facility Based Crisis:  These are 3 locations for services: Please call before arrival   Address: [address] [place], [place] [address] Phone: [phone]  Address: [address] [place], [place] [address] Phone#: [phone]  Address: [address] [place], [place] [address] Phone#: [phone]   [facility]): (offers outpatient therapy and intensive outpatient substance abuse therapy).  [address], [place], [place] [address] Phone: [phone]  [facility]: Offers FREE recovery skills classes, support groups, 1:1 Peer Support, and Compeer Classes. [address], [place], [place] [address] Phone: [phone] (Call to complete intake).  [facility] [address] [place], [place] [address] Phone: [phone] ext: [phone] [facility] provides food, shelter and other programs and services to the homeless men of [place] through our [employer].  By offering safe shelter, three meals a day, clean  clothing, Biblical counseling, financial planning, vocational training, GED/education and employment assistance, we've helped mend the shattered lives of many homeless men since opening in 1974.  We have approximately 267 beds available, with a max of 312 beds including mats for emergency situations and currently house an average of 270 men a night.  Prospective Client Check-In Information Photo ID Required (State/ Out of State/ [facility]) - if photo ID is not available, clients are required to have a printout of a police/sheriff's criminal history report. Help out with chores around the [facility]. No sex offender of any type (pending, charged, registered and/or any other sex related offenses) will be permitted to check in. Must be willing to abide by all rules, regulations, and policies established by the [employer]. The following will be provided - shelter, food, clothing, and biblical counseling. If you or someone you know is in need of assistance at our [facility] shelter in [place], [place], please call [phone] ext. [phone].  [facility]-will provide timely access to mental health services for children and adolescents (4-17) and adults presenting in a mental health crisis. The program is designed for those who need urgent Behavioral Health or Substance Use treatment and are not experiencing a medical crisis that would typically require an emergency room visit.    [address] [place], [place] [address] Phone: [phone] [email or web address]  [facility]: Phone#: [phone]  The [facility] SA Intensive Outpatient Program (SAIOP) means structured individual and group addiction activities and services that are provided at an outpatient program designed to assist adult and adolescent consumers to begin recovery and learn skills for recovery maintenance. The [employer] SAIOP program is offered at least 3 hours a day, 3 days a  week.SAIOP services shall include a structured program consisting of, but not limited to, the following services: Individual counseling and support; Group counseling and support; Family counseling, training or support; Biochemical assays to identify recent drug use (e.g., urine drug screens); Strategies for relapse prevention to include community and social support systems in treatment; Life skills; Crisis contingency planning; Disease Management; and Treatment support activities that have been adapted or specifically designed for persons with physical disabilities, or persons with co-occurring disorders of mental illness and substance abuse/dependence or mental retardation/developmental disability and substance abuse/dependence. Phone: [phone]  Address:   [facility] will also offer the following outpatient services: (Monday through Friday 8am-5pm)   Partial Hospitalization Program (PHP) Substance Abuse Intensive Outpatient Program (SA-IOP) Group Therapy Medication Management Peer Living Room We also provide (24/7):  Assessments: Our mental health clinician and providers will conduct a focused mental health evaluation, assessing for immediate safety concerns and further mental health needs. Referral: Our team will provide resources and help connect to community based mental health treatment, when indicated, including psychotherapy, psychiatry, and other specialized behavioral health or substance use disorder services (for those not already in treatment). Transitional Care: Our team providers in person bridging and/or telephonic follow-up during the patient's transition to outpatient services.  [facility] 24-Hour Call Center: [phone] Behavioral Health Crisis Line: [phone]

## 2020-11-15 NOTE — ED Notes (Signed)
Pt ambulatory, alert, and oriented X4 on and off the unit. Education, support, and encouragement provided. Discharge summary/AVS, prescriptions, medications, and follow up appointments reviewed with pt., however, pt refused teaching and refused printed prescriptions and AVS, stating "I don't need it and don't want it." Pt's belongings in locker returned and belongings sheet signed. Pt denies SI/HI, A/VH, pain, or any concerns at this time. Pt discharged to lobby.

## 2020-11-15 NOTE — ED Notes (Signed)
Pt expressed he currently is SI and has a plan. Pt states he would "Sell my bracelet and necklace for 40 of Fentanyl and do it." RN Notified.

## 2020-11-15 NOTE — Progress Notes (Signed)
CSW provided that following resources for the patient to utilize.  Homeless Shelter List:   [facility]) [address] [place], [place] Phone: [phone]   [facility] has been providing emergency shelter to those in need of a permanent residence for over 35 years. The [employer] shelter plays an important role in our community.    There are many life events that can pull someone into a downward spiral towards poverty that is very difficult to get out of. Homelessness is a problem that can affect anyone of [place]. [employer] is a safe and comforting place to stay, especially if you have experienced the hardship of street life.    [employer] provides a single bed and bedding to 100 adult men and women. The shelter welcomes all who are in need of housing, no one in real need is turned away unless space is not available.    While staying at [facility], guests are offered more than just a bed for a night. Hot meals are provided and every guest has access to case management services. Case managers provide assistance with finding housing, employment, or other services that will help them gain stability. Continuous stay is based on availability, capacity, and progress towards goals.    To contact the front desk of [facility] please call   [phone] ext [phone] or ext. [phone].   [facility] [address], [place], [place] [address] Phone: [phone]   [facility] (Women only) [address], [place] [address] Phone: [phone]   [facility] [address], [place] [address] Phone: [phone]   [facility]: [phone]. [address] [place], [place] [address] Phone: [phone]   [facility] [address] [place], [place] [address] Check in at 6:00PM for placement at a local shelter) Phone:  [phone]   [facility] Recovery Services Residential - Admissions are currently completed Monday through Friday at 8am; both appointments and walk-ins are accepted.  Any individual that is a [facility] resident may present for a substance abuse screening and assessment for admission.  A person may be referred by numerous sources or self-refer.   Potential clients will be screened for medical necessity and appropriateness for the program.  Clients must meet criteria for high-intensity residential treatment services.  If clinically appropriate, a client will continue with the comprehensive clinical assessment and intake process, as well as enrollment in the [facility].  Address: [address] [place], [place] [address] Admin Hours: Mon-Fri 8AM to [facility] Center Hours: 24/7 Phone: [phone] Fax: [phone]  [facility] Facility Based Crisis:  These are 3 locations for services: Please call before arrival   Address: [address] [place], [place] [address] Phone: [phone]  Address: [address] [place], [place] [address] Phone#: [phone]  Address: [address] [place], [place] [address] Phone#: [phone]   [facility] ([facility]): (offers outpatient therapy and intensive outpatient substance abuse therapy).  [address], [place], [place] [address] Phone: [phone]  [facility] : Offers FREE recovery skills classes, support groups, 1:1 Peer Support, and Compeer Classes. [address], [place], [place] [address] Phone: [phone] (Call to complete intake).  [facility] [address] [place], [place] [address] Phone: [phone] ext: [phone] [facility] provides food,  shelter and other programs and services to the homeless men of [place] through our [employer].  By offering safe shelter, three meals a day, clean clothing, Biblical  counseling, financial planning, vocational training, GED/education and employment assistance, we've helped mend the shattered lives of many homeless men since opening in 1974.  We have approximately 267 beds available, with a max of 312 beds including mats for emergency situations and currently house an average of 270 men a night.  Prospective Client Check-In Information Photo ID Required (State/ Out of State/ [facility]) - if photo ID is not available, clients are required to have a printout of a police/sheriff's criminal history report. Help out with chores around the [facility]. No sex offender of any type (pending, charged, registered and/or any other sex related offenses) will be permitted to check in. Must be willing to abide by all rules, regulations, and policies established by the [employer]. The following will be provided - shelter, food, clothing, and biblical counseling. If you or someone you know is in need of assistance at our [facility] shelter in [place], [place], please call [phone] ext. [phone].  [facility]-will provide timely access to mental health services for children and adolescents (4-17) and adults presenting in a mental health crisis. The program is designed for those who need urgent Behavioral Health or Substance Use treatment and are not experiencing a medical crisis that would typically require an emergency room visit.    [address] [place], [place] [address] Phone: [phone] [email or web address]  [facility]: Phone#: [phone]  The [facility] SA Intensive Outpatient Program (SAIOP) means structured individual and group addiction activities and services that are provided at an outpatient program designed to assist adult and adolescent consumers to begin recovery and learn skills for recovery maintenance. The [employer] SAIOP program is offered at least 3 hours a day, 3 days a week.SAIOP services  shall include a structured program consisting of, but not limited to, the following services: Individual counseling and support; Group counseling and support; Family counseling, training or support; Biochemical assays to identify recent drug use (e.g., urine drug screens); Strategies for relapse prevention to include community and social support systems in treatment; Life skills; Crisis contingency planning; Disease Management; and Treatment support activities that have been adapted or specifically designed for persons with physical disabilities, or persons with co-occurring disorders of mental illness and substance abuse/dependence or mental retardation/developmental disability and substance abuse/dependence. Phone: [phone]  Address:   [facility] will also offer the following outpatient services: (Monday through Friday 8am-5pm)   Partial Hospitalization Program (PHP) Substance Abuse Intensive Outpatient Program (SA-IOP) Group Therapy Medication Management Peer Living Room We also provide (24/7):  Assessments: Our mental health clinician and providers will conduct a focused mental health evaluation, assessing for immediate safety concerns and further mental health needs. Referral: Our team will provide resources and help connect to community based mental health treatment, when indicated, including psychotherapy, psychiatry, and other specialized behavioral health or substance use disorder services (for those not already in treatment). Transitional Care: Our team providers in person bridging and/or telephonic follow-up during the patient's transition to outpatient services.  [facility] 24-Hour Call Center: [phone] Behavioral Health Crisis Line: [phone]   Crissie Reese, MSW, LCSW-A, [place] Phone: [phone] Disposition/TOC

## 2020-11-15 NOTE — ED Triage Notes (Signed)
Pt came in with c/o SI. Has a plan to snort Fentanyl. Last used meth three days ago. Pt is currently homeless

## 2020-11-15 NOTE — ED Provider Notes (Addendum)
FBC/OBS ASAP Discharge Summary  Date and Time: 11/15/2020 11:14 AM  Name: Tristan Valdez  MRN:  [id number]   Discharge Diagnoses:  Final diagnoses:  Substance induced mood disorder (HCC)  Suicidal ideation  MDD (major depressive disorder), recurrent severe, without psychosis (HCC)    Subjective: "You have to keep calling Marlena, eventually she will answer. She has set me up with housing and treatment." He states that he was on his way to treatment at the end of June but his boss begged to him to stay, and now he is back in the same situation. He states that his boss is controlling and tries to control everything. He states that he started dating a girl and then she disappeared. He states that he started back using meth again and has been using meth every day for the last 3 days. He states that he has been using meth off and on for 6 years. He states that he has been to several substance abuse treatment facilities including [facility], [facility], and [facility]. He states that he does not want to go to a place where he has to work. He states that he prefers substance abuse treatment facilities only. He states that he is not willing to go to just any substance abuse treatment facility.  Per Admission note: Patient is seen and examined today.  Patient states his mood is not good and rates it 0/10 (10 is the best mood).  Patient rates his anxiety at a 10/10 (10 is worse anxiety).  Patient denies any active suicidal ideation but cannot contract for safety at this time..  States yesterday he was thinking of overdosing on fentanyl but not today.  He denies HI, AVH.  He states he was living at a hotel with his girlfriend but his girlfriend did not call him recently and now he has nowhere to go. He states yesterday he was having thoughts of hurting his girlfriends ex boyfriend states "because he started a bunch of shit".  He states he is planning to go to outpatient substance abuse program  [facility] in [place].  He states he already called them and arranged housing and job. 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 He states he does not want to stay in [place] anymore. Pt gave Phone number of person Gwendolyn Lima [phone])who confirmed that Pt can go to [place] for  outpatient substance abuse rehab.  Stay Summary: On approach, patient is alert and oriented x4. He is irritable and somewhat uncooperative. His thought process is logical and relevant. His speech is coherent. He denies suicidal ideations but states that he would be suicidal if he does not get to where he needs to go. He appears to have secondary gain for housing. He reports HI towards his boss because his boss called him a "bitch." He does not disclose his boss's name. He denies auditory and visual hallucinations. He denies paranoia. He does not appear to be responding to internal or external stimuli. UDS positive for Amphetamines.  Social work contacted to assist with follow up resources: Per LCSW, [place] in reference to possible placement at a facility in [place], [place].  It was reported that she plans on getting the patient in an outpatient treatment center called [facility] located at [address], [place] [address]/Phone#: [phone]. It shows that the  facility is only operational during the week days from 8am-5pm. Gwendolyn Lima advised that she does not have a way for the patient to get to [place], however can pay for a taxi to her residence from the bus station down there, if he is able to caught a bus to [place], [place]. Gwendolyn Lima also advised on plans to possible getting the patient into a inpatient program in [place]. Call [facility] [phone] to do an phone assessment or attempt to get into [facility].   This provider discussed the stated resources with the patient. He states  that he cannot get to [place], [place]. He declined the other two options and demanded to leave.   Past Psychiatric History: MDD and Substance induced mood dx.  Past Medical History:  Past Medical History:  Diagnosis Date   Arthritis    Bilateral club feet    Hypertension     Past Surgical History:  Procedure Laterality Date   APPENDECTOMY     reconstruction of tendons of left arm Left    Family History:  Family History  Problem Relation Age of Onset   CAD Mother    CAD Father    Family Psychiatric History: Unknown Social History:  Social History   Substance and Sexual Activity  Alcohol Use Never     Social History   Substance and Sexual Activity  Drug Use Not Currently   Types: Methamphetamines   Comment: Fentanyl    Social History   Socioeconomic History   Marital status: Divorced    Spouse name: Not on file   Number of children: Not on file   Years of education: Not on file   Highest education level: Not on file  Occupational History   Not on file  Tobacco Use   Smoking status: Every Day    Packs/day: 1.00    Types: Cigarettes   Smokeless tobacco: Never   Tobacco comments:    Last used: 2 days ago  Vaping Use   Vaping Use: Some days  Substance and Sexual Activity   Alcohol use: Never   Drug use: Not Currently    Types: Methamphetamines    Comment: Fentanyl   Sexual activity: Not Currently  Other Topics Concern   Not on file  Social History Narrative   Not on file   Social Determinants of Health   Financial Resource Strain: Not on file  Food Insecurity: Not on file  Transportation Needs: Not on file  Physical Activity: Not on file  Stress: Not on file  Social Connections: Not on file   SDOH:  SDOH Screenings   Alcohol Screen: Low Risk    Last Alcohol Screening Score (AUDIT): 0  Depression (PHQ2-9): Not on file  Financial Resource Strain: Not on file  Food Insecurity: Not on file  Housing: Not on file  Physical Activity: Not on file   Social Connections: Not on file  Stress: Not on file  Tobacco Use: High Risk   Smoking Tobacco Use: Every Day   Smokeless Tobacco Use: Never  Transportation Needs: Not on file    Tobacco Cessation:  A prescription for an FDA-approved tobacco cessation medication provided at discharge  Current Medications:  Current Facility-Administered Medications  Medication Dose Route Frequency Provider Last Rate Last Admin   acetaminophen (TYLENOL) tablet 650 mg  650 mg Oral Q6H PRN Doda, Vandana, MD       hydrOXYzine (ATARAX/VISTARIL) tablet 25 mg  25 mg Oral TID PRN Karsten Ro, MD  mirtazapine (REMERON) tablet 15 mg  15 mg Oral QHS Karsten Ro, MD   15 mg at 11/14/20 2231   nicotine (NICODERM CQ - dosed in mg/24 hours) patch 14 mg  14 mg Transdermal Daily Karsten Ro, MD   14 mg at 11/14/20 1241   traZODone (DESYREL) tablet 50 mg  50 mg Oral QHS PRN Karsten Ro, MD       Current Outpatient Medications  Medication Sig Dispense Refill   amoxicillin (AMOXIL) 875 MG tablet Take 1 tablet (875 mg total) by mouth 2 (two) times daily for 7 days. 14 tablet 0   hydrOXYzine (ATARAX/VISTARIL) 25 MG tablet Take 1 tablet (25 mg total) by mouth 3 (three) times daily as needed for anxiety. 30 tablet 0   mirtazapine (REMERON) 15 MG tablet Take 1 tablet (15 mg total) by mouth at bedtime. 30 tablet 0   nicotine (NICODERM CQ - DOSED IN MG/24 HOURS) 14 mg/24hr patch Place 1 patch (14 mg total) onto the skin daily. 28 patch 0    PTA Medications: (Not in a hospital admission)   Musculoskeletal  Strength & Muscle Tone: within normal limits Gait & Station: normal Patient leans: N/A  Psychiatric Specialty Exam  Presentation  General Appearance: Appropriate for Environment  Eye Contact:Fair  Speech:Clear and Coherent  Speech Volume:Normal  Handedness:Right   Mood and Affect  Mood:Irritable  Affect:Congruent   Thought Process  Thought Processes:Coherent; Goal Directed  Descriptions  of Associations:Intact  Orientation:Full (Time, Place and Person)  Thought Content:WDL  Diagnosis of Schizophrenia or Schizoaffective disorder in past: No  Duration of Psychotic Symptoms: Less than six months   Hallucinations:Hallucinations: None  Ideas of Reference:None  Suicidal Thoughts:Suicidal Thoughts: Yes, Passive SI Active Intent and/or Plan: Without Intent; Without Plan  Homicidal Thoughts:Homicidal Thoughts: No   Sensorium  Memory:Immediate Fair; Recent Fair; Remote Fair  Judgment:Intact  Insight:Present   Executive Functions  Concentration:Fair  Attention Span:Fair  Recall:Fair  Fund of Knowledge:Fair  Language:Poor   Psychomotor Activity  Psychomotor Activity:Psychomotor Activity: Normal   Assets  Assets:Desire for Improvement; Manufacturing systems engineer; Leisure Time; Physical Health   Sleep  Sleep:Sleep: Fair Number of Hours of Sleep: 6   Nutritional Assessment (For OBS and FBC admissions only) Has the patient had a weight loss or gain of 10 pounds or more in the last 3 months?: No Has the patient had a decrease in food intake/or appetite?: No Does the patient have dental problems?: No Does the patient have eating habits or behaviors that may be indicators of an eating disorder including binging or inducing vomiting?: No Has the patient recently lost weight without trying?: No Has the patient been eating poorly because of a decreased appetite?: No Malnutrition Screening Tool Score: 0    Physical Exam  Physical Exam Eyes:     Conjunctiva/sclera: Conjunctivae normal.  Cardiovascular:     Rate and Rhythm: Bradycardia present.  Pulmonary:     Effort: Pulmonary effort is normal.  Musculoskeletal:        General: Normal range of motion.     Cervical back: Normal range of motion.  Neurological:     Mental Status: He is alert and oriented to person, place, and time.   Review of Systems  Constitutional: Negative.   HENT: Negative.    Eyes:  Negative.   Respiratory: Negative.    Cardiovascular: Negative.   Gastrointestinal: Negative.   Genitourinary: Negative.   Musculoskeletal: Negative.   Skin: Negative.   Neurological: Negative.   Endo/Heme/Allergies: Negative.  Psychiatric/Behavioral:  Positive for substance abuse.   Blood pressure (!) 148/82, pulse (!) 53, temperature 97.6 F (36.4 C), temperature source Oral, resp. rate 16, SpO2 100 %. There is no height or weight on file to calculate BMI.  Demographic Factors:  Male, Low socioeconomic status, Living alone, and Unemployed  Loss Factors: Financial problems/change in socioeconomic status  Historical Factors: Prior suicide attempts and Impulsivity  Risk Reduction Factors:   Positive social support  Continued Clinical Symptoms:  Alcohol/Substance Abuse/Dependencies  Cognitive Features That Contribute To Risk:  None    Suicide Risk:  Minimal: No identifiable suicidal ideation.  Patients presenting with no risk factors but with morbid ruminations; may be classified as minimal risk based on the severity of the depressive symptoms  Plan Of Care/Follow-up recommendations:  Activity:  as tolerated   Disposition: Discharge to home. Follow up with [facility] [place]. [facility] as an alternative for medication management and therapy. Follow up with substance abuse resources.   Vanna Sailer L, NP 11/15/2020, 11:14 AM

## 2020-11-15 NOTE — ED Notes (Signed)
Pt sleeping at present, no distress noted.  Monitoring for safety. 

## 2020-11-16 ENCOUNTER — Other Ambulatory Visit: Payer: Self-pay

## 2020-11-16 ENCOUNTER — Ambulatory Visit (HOSPITAL_COMMUNITY)
Admission: EM | Admit: 2020-11-16 | Discharge: 2020-11-16 | Disposition: A | Discharge status: Home / Self Care | Payer: No Typology Code available for payment source | Attending: Nurse Practitioner | Admitting: Nurse Practitioner

## 2020-11-16 DIAGNOSIS — F332 Major depressive disorder, recurrent severe without psychotic features: Secondary | ICD-10-CM

## 2020-11-16 LAB — COMPREHENSIVE METABOLIC PANEL
ALT: 35 U/L (ref 0–44)
AST: 25 U/L (ref 15–41)
Albumin: 3.6 g/dL (ref 3.5–5.0)
Alkaline Phosphatase: 51 U/L (ref 38–126)
Anion gap: 8 (ref 5–15)
BUN: 19 mg/dL (ref 6–20)
CO2: 27 mmol/L (ref 22–32)
Calcium: 8.8 mg/dL — ABNORMAL LOW (ref 8.9–10.3)
Chloride: 105 mmol/L (ref 98–111)
Creatinine, Ser: 0.82 mg/dL (ref 0.61–1.24)
GFR, Estimated: >60 mL/min (ref >60)
Glucose, Bld: 90 mg/dL (ref 70–99)
Potassium: 3.4 mmol/L — ABNORMAL LOW (ref 3.5–5.1)
Sodium: 140 mmol/L (ref 135–145)
Total Bilirubin: 0.9 mg/dL (ref 0.3–1.2)
Total Protein: 6.4 g/dL — ABNORMAL LOW (ref 6.5–8.1)

## 2020-11-16 LAB — RESP PANEL BY RT-PCR (FLU A&B, COVID) ARPGX2
Influenza A by PCR: NEGATIVE
Influenza B by PCR: NEGATIVE
SARS Coronavirus 2 by RT PCR: NEGATIVE

## 2020-11-16 LAB — CBC WITH DIFFERENTIAL/PLATELET
Abs Immature Granulocytes: 0.02 10*3/uL (ref 0.00–0.07)
Basophils Absolute: 0 10*3/uL (ref 0.0–0.1)
Basophils Relative: 0 %
Eosinophils Absolute: 0.1 10*3/uL (ref 0.0–0.5)
Eosinophils Relative: 2 %
HCT: 44.7 % (ref 39.0–52.0)
Hemoglobin: 14.6 g/dL (ref 13.0–17.0)
Immature Granulocytes: 0 %
Lymphocytes Relative: 26 %
Lymphs Abs: 1.7 10*3/uL (ref 0.7–4.0)
MCH: 30.7 pg (ref 26.0–34.0)
MCHC: 32.7 g/dL (ref 30.0–36.0)
MCV: 93.9 fL (ref 80.0–100.0)
Monocytes Absolute: 0.8 10*3/uL (ref 0.1–1.0)
Monocytes Relative: 12 %
Neutro Abs: 3.9 10*3/uL (ref 1.7–7.7)
Neutrophils Relative %: 60 %
Platelets: 153 10*3/uL (ref 150–400)
RBC: 4.76 MIL/uL (ref 4.22–5.81)
RDW: 13.2 % (ref 11.5–15.5)
WBC: 6.6 10*3/uL (ref 4.0–10.5)
nRBC: 0 % (ref 0.0–0.2)

## 2020-11-16 LAB — RAPID URINE DRUG SCREEN, HOSP PERFORMED
Amphetamines: POSITIVE — AB
Barbiturates: NOT DETECTED
Benzodiazepines: NOT DETECTED
Cocaine: NOT DETECTED
Opiates: NOT DETECTED
Tetrahydrocannabinol: NOT DETECTED

## 2020-11-16 MED ORDER — ALUM & MAG HYDROXIDE-SIMETH 200-200-20 MG/5ML PO SUSP: 30.0000 mL | ORAL | Status: DC | PRN

## 2020-11-16 MED ORDER — HYDROXYZINE HCL 25 MG PO TABS: 25.0000 mg | ORAL_TABLET | Freq: Three times a day (TID) | ORAL | Status: DC | PRN

## 2020-11-16 MED ORDER — MAGNESIUM HYDROXIDE 400 MG/5ML PO SUSP: 30.0000 mL | Freq: Every day | ORAL | Status: DC | PRN

## 2020-11-16 MED ORDER — ACETAMINOPHEN 325 MG PO TABS: 650.0000 mg | ORAL_TABLET | Freq: Four times a day (QID) | ORAL | Status: DC | PRN

## 2020-11-16 NOTE — ED Notes (Signed)
Pt admitted to continuous assessment due to SI. Pt A&O x4, calm and cooperative. Pt tolerated skin assessment well. Pt ambulated independently to unit. Oriented to unit/staff. Roast beef sandwich, pretzels, and apple juice given. No signs of acute distress noted. Will continue to monitor for safety.

## 2020-11-16 NOTE — Progress Notes (Signed)
CSW provided that following resources for the patient to utilize. It should be noted that CSW contacted [facility] in [place] who advised that they are currently at capacity. Further, it should be noted that [facility] possibly have bed availability, however staffing at the facility low. CSW suggested to provider that the patient can reach out to the resources below for possible placement.    Homeless Shelter List:   [facility]) [address] [place], [place] Phone: [phone]   [facility] has been providing emergency shelter to those in need of a permanent residence for over 35 years. The [facility] shelter plays an important role in our community.   There are many life events that can pull someone into a downward spiral towards poverty that is very difficult to get out of. Homelessness is a problem that can affect anyone of [place]. [facility] is a safe and comforting place to stay, especially if you have experienced the hardship of street life.   [facility] provides a single bed and bedding to 100 adult men and women. The shelter welcomes all who are in need of housing, no one in real need is turned away unless space is not available.   While staying at [facility], guests are offered more than just a bed for a night. Hot meals are provided and every guest has access to case management services. Case managers provide assistance with finding housing, employment, or other services that will help them gain stability. Continuous stay is based on availability, capacity, and progress towards goals.   To contact the front desk of [facility] please call   [phone] ext [phone] or ext. [phone].   [facility] [address], [place], [place] [address] Phone: [phone]   [facility] (Women only) [address] [place], [place] [address] Phone:  [phone]   [facility] [address], [place] [address] Phone: [phone]   [facility]: [phone]. [address] [place], [place] [address] Phone: [phone]   [facility] [address], [place], [place] [address] Check in at 6:00PM for placement at a local shelter) Phone: [phone]   [facility] - Admissions are currently completed Monday through Friday at 8am; both appointments and walk-ins are accepted.  Any individual that is a [facility] resident may present for a substance abuse screening and assessment for admission.  A person may be referred by numerous sources or self-refer.   Potential clients will be screened for medical necessity and appropriateness for the program.  Clients must meet criteria for high-intensity residential treatment services.  If clinically appropriate, a client will continue with the comprehensive clinical assessment and intake process, as well as enrollment in the [facility].   Address: [address] [place], [place] [address] Admin Hours: Mon-Fri 8AM to [facility] Hours: 24/7 Phone: [phone] Fax: [phone]   [facility] Facility Based Crisis: These are 3 locations for services: Please call before arrival   Address: [address] [place], [place] [address] Phone: [phone]   Address: [address] [place], [place] [address] Phone#: [phone]   Address: [address] [place], [place] [address] Phone#: [phone]     [facility]): (offers outpatient therapy and intensive outpatient substance abuse therapy). [address] [place], [place] [address]  Phone: [phone]   [facility]: Offers FREE recovery skills classes, support groups, 1:1 Peer Support, and Compeer Classes. [address] [place], [place] [address] Phone: [phone] (Call to complete intake). [facility] [address] [place], [place] [address] Phone: [phone] ext: [phone] The [facility] provides food, shelter and other programs and services to the homeless men of [place] through our [employer].   By offering safe shelter, three meals a day, clean clothing, Biblical counseling, financial planning, vocational training, GED/education and employment assistance, we've helped mend the shattered lives of many homeless men since opening in 1974.   We have approximately 267 beds available, with a max of 312 beds including mats for emergency situations and currently house an average of 270 men a night.   Prospective Client Check-In Information Photo ID Required (State/ Out of State/ [facility]) - if photo ID is not available, clients are required to have a printout of a police/sheriff's criminal history report. Help out with chores around the [facility]. No sex offender of any type (pending, charged, registered and/or any other sex related offenses) will be permitted to check in. Must be willing to abide by all rules, regulations, and policies established by the [facility]. The following will be provided - shelter, food, clothing, and biblical counseling. If you or someone you know is in need of assistance at our [facility] shelter in [place], [place], please call [phone] ext. [phone].   [facility]-will provide timely access to mental health services for children and adolescents (4-17) and adults presenting in a mental health crisis. The program is designed for those who need urgent Behavioral Health or Substance Use treatment and are not experiencing a medical crisis that would typically require an emergency room visit.   [address] [place], [place] [address] Phone: [phone] [email or web address]   [facility]: Phone#: [phone]   The  [facility] SA Intensive Outpatient Program (SAIOP) means structured individual and group addiction activities and services that are provided at an outpatient program designed to assist adult and adolescent consumers to begin recovery and learn skills for recovery maintenance. The [facility] SAIOP program is offered at least 3 hours a day, 3 days a week.SAIOP services shall include a structured program consisting of, but not limited to, the following services: Individual counseling and support; Group counseling and support; Family counseling, training or support; Biochemical assays to identify recent drug use (e.g., urine drug screens); Strategies for relapse prevention to include community and social support systems in treatment; Life skills; Crisis contingency planning; Disease Management; and Treatment support activities that have been adapted or specifically designed for persons with physical disabilities, or persons with co-occurring disorders of mental illness and substance abuse/dependence or mental retardation/developmental disability and substance abuse/dependence. Phone: [phone]  Address:   [facility] will also offer the following outpatient services: (Monday through Friday 8am-5pm)   Partial Hospitalization Program (PHP) Substance Abuse Intensive Outpatient Program (SA-IOP) Group Therapy Medication Management Peer Living Room We also provide (24/7): Assessments: Our mental health clinician and providers will conduct a focused mental health evaluation, assessing for immediate safety concerns and further mental health needs. Referral: Our team will provide resources and help connect to community based mental health treatment, when indicated, including psychotherapy, psychiatry, and other specialized behavioral health or substance use disorder services (for those not already in treatment). Transitional Care: Our team providers in person bridging and/or  telephonic follow-up during the patient's transition to outpatient services. The [facility] 24-Hour Call Center: [phone] Behavioral Health Crisis Line: [phone]    Crissie Reese, MSW, LCSW-A, [place] Phone: [phone] Disposition/TOC

## 2020-11-16 NOTE — ED Notes (Signed)
Resting with eyes closed. Rise and fall of chest noted. No new issues reported. Will continue to monitor for safety 

## 2020-11-16 NOTE — ED Notes (Signed)
Pt has two-pt's bag in cabinet 5-8, which has his cloth, shoe, book-bag and phonme. Pt stated that he will get numbers from his phone tomorrow instead of tonight.

## 2020-11-16 NOTE — ED Provider Notes (Signed)
Behavioral Health Admission H&P [facility] & OBS)  Date: 11/16/20 Patient Name: Tristan Valdez MRN: [id number] Chief Complaint: I will over dose on fentanyl Chief Complaint  Patient presents with   Suicidal      Diagnoses:  Final diagnoses:  MDD (major depressive disorder), recurrent severe, without psychosis (HCC)    HPI: Tristan Valdez is a 50 y/o male with a history of major depressive disorder, suicidal ideation and substance-induced mood disorder who presented to [place] [facility] ED for suicidal ideation.  Patient is alert and oriented x4, and he is very irritable and angry and feels that no one is trying to help him.  Patient was assessed and evaluated at [facility] on 11/15/2020 for increased anxiety and suicidal ideations.  His disposition from the [facility] was to go to a substance abuse treatment facility in [place], [place]. Transportation arrangements for patient to go to the facility fell through.  Patient states that if he is discharged today without transportation arranged for him to go to the facility in [place] that he plans to overdose on fentanyl.  Patient states that he is not interested in any other options and that the [place] program is not a good program because it will just got require him to work.  Patient also states that he feels that he was lied to at the ED tonight, because he was told he was going to [facility].  NP explained to patient that he was at behavioral health and patient stated, " I was just here and they did not help me". Patient denies any AVH.  PHQ 2-9:   Flowsheet Row ED from 11/16/2020 in [facility] ED from 11/15/2020 in [facility] DEPT ED from 11/14/2020 in [facility]  C-SSRS RISK CATEGORY High Risk High Risk High Risk        Total Time spent with patient: 30 minutes  Musculoskeletal  Strength & Muscle Tone: within normal limits Gait & Station:  normal Patient leans: N/A  Psychiatric Specialty Exam  Presentation General Appearance: Disheveled  Eye Contact:Fair  Speech:Clear and Coherent  Speech Volume:Normal  Handedness:Right   Mood and Affect  Mood:Angry; Irritable  Affect:Blunt; Flat   Thought Process  Thought Processes:Coherent  Descriptions of Associations:Intact  Orientation:Full (Time, Place and Person)  Thought Content:WDL  Diagnosis of Schizophrenia or Schizoaffective disorder in past: No  Duration of Psychotic Symptoms: Less than six months  Hallucinations:Hallucinations: None  Ideas of Reference:None  Suicidal Thoughts:Suicidal Thoughts: Yes, Active SI Active Intent and/or Plan: With Intent; With Plan  Homicidal Thoughts:Homicidal Thoughts: No   Sensorium  Memory:Immediate Good; Recent Good; Remote Good  Judgment:Fair  Insight:Fair   Executive Functions  Concentration:Fair  Attention Span:Fair  Recall:Fair  Fund of Knowledge:Fair  Language:Fair   Psychomotor Activity  Psychomotor Activity:Psychomotor Activity: Normal   Assets  Assets:Communication Skills; Physical Health; Resilience; Desire for Improvement   Sleep  Sleep:Sleep: Fair Number of Hours of Sleep: -1   Nutritional Assessment (For OBS and FBC admissions only) Has the patient had a weight loss or gain of 10 pounds or more in the last 3 months?: No Has the patient had a decrease in food intake/or appetite?: No Does the patient have dental problems?: No Does the patient have eating habits or behaviors that may be indicators of an eating disorder including binging or inducing vomiting?: No Has the patient recently lost weight without trying?: No Has the patient been eating poorly because of a decreased appetite?: No Malnutrition Screening  Tool Score: 0    Physical Exam HENT:     Head: Normocephalic and atraumatic.     Nose: Nose normal.  Eyes:     Pupils: Pupils are equal, round, and reactive to  light.  Cardiovascular:     Rate and Rhythm: Normal rate.  Pulmonary:     Effort: Pulmonary effort is normal.  Abdominal:     General: Abdomen is flat.  Musculoskeletal:        General: Normal range of motion.     Cervical back: Normal range of motion.  Skin:    General: Skin is warm.  Neurological:     General: No focal deficit present.     Mental Status: He is alert.  Psychiatric:        Attention and Perception: Attention normal.        Mood and Affect: Mood is depressed. Affect is angry.        Speech: Speech normal.        Behavior: Behavior is agitated.        Thought Content: Thought content normal.        Cognition and Memory: Cognition normal.        Judgment: Judgment is impulsive.   Review of Systems  Constitutional: Negative.   HENT: Negative.    Eyes: Negative.   Respiratory: Negative.    Cardiovascular: Negative.   Gastrointestinal: Negative.   Genitourinary: Negative.   Musculoskeletal: Negative.   Skin: Negative.   Neurological: Negative.   Endo/Heme/Allergies: Negative.   Psychiatric/Behavioral:  Positive for substance abuse and suicidal ideas.    Blood pressure 129/89, pulse (!) 58, temperature 98.6 F (37 C), temperature source Oral, resp. rate 16, SpO2 100 %. There is no height or weight on file to calculate BMI.  Past Psychiatric History:[facility] 11/15/20  Is the patient at risk to self? Yes  Has the patient been a risk to self in the past 6 months? Yes .    Has the patient been a risk to self within the distant past? Yes   Is the patient a risk to others? No   Has the patient been a risk to others in the past 6 months? No   Has the patient been a risk to others within the distant past? No   Past Medical History:  Past Medical History:  Diagnosis Date   Arthritis    Bilateral club feet    Hypertension     Past Surgical History:  Procedure Laterality Date   APPENDECTOMY     reconstruction of tendons of left arm Left     Family History:   Family History  Problem Relation Age of Onset   CAD Mother    CAD Father     Social History:  Social History   Socioeconomic History   Marital status: Divorced    Spouse name: Not on file   Number of children: Not on file   Years of education: Not on file   Highest education level: Not on file  Occupational History   Not on file  Tobacco Use   Smoking status: Every Day    Packs/day: 1.00    Types: Cigarettes   Smokeless tobacco: Never   Tobacco comments:    Last used: 2 days ago  Vaping Use   Vaping Use: Some days  Substance and Sexual Activity   Alcohol use: Never   Drug use: Not Currently    Types: Methamphetamines    Comment: Fentanyl   Sexual  activity: Not Currently  Other Topics Concern   Not on file  Social History Narrative   Not on file   Social Determinants of Health   Financial Resource Strain: Not on file  Food Insecurity: Not on file  Transportation Needs: Not on file  Physical Activity: Not on file  Stress: Not on file  Social Connections: Not on file  Intimate Partner Violence: Not on file    SDOH:  SDOH Screenings   Alcohol Screen: Low Risk    Last Alcohol Screening Score (AUDIT): 0  Depression (PHQ2-9): Not on file  Financial Resource Strain: Not on file  Food Insecurity: Not on file  Housing: Not on file  Physical Activity: Not on file  Social Connections: Not on file  Stress: Not on file  Tobacco Use: High Risk   Smoking Tobacco Use: Every Day   Smokeless Tobacco Use: Never  Transportation Needs: Not on file    Last Labs:  Admission on 11/15/2020, Discharged on 11/16/2020  Component Date Value Ref Range Status   Sodium 11/15/2020 140  135 - 145 mmol/L Final   Potassium 11/15/2020 3.4 (A) 3.5 - 5.1 mmol/L Final   Chloride 11/15/2020 105  98 - 111 mmol/L Final   CO2 11/15/2020 27  22 - 32 mmol/L Final   Glucose, Bld 11/15/2020 90  70 - 99 mg/dL Final   Glucose reference range applies only to samples taken after fasting for at  least 8 hours.   BUN 11/15/2020 19  6 - 20 mg/dL Final   Creatinine, Ser 11/15/2020 0.82  0.61 - 1.24 mg/dL Final   Calcium 16/04/930 8.8 (A) 8.9 - 10.3 mg/dL Final   Total Protein 35/57/3220 6.4 (A) 6.5 - 8.1 g/dL Final   Albumin 25/42/7062 3.6  3.5 - 5.0 g/dL Final   AST 37/62/8315 25  15 - 41 U/L Final   ALT 11/15/2020 35  0 - 44 U/L Final   Alkaline Phosphatase 11/15/2020 51  38 - 126 U/L Final   Total Bilirubin 11/15/2020 0.9  0.3 - 1.2 mg/dL Final   GFR, Estimated 11/15/2020 >60  >60 mL/min Final   Comment: (NOTE) Calculated using the CKD-EPI Creatinine Equation (2021)    Anion gap 11/15/2020 8  5 - 15 Final   Performed at [facility], [address] [place], [place] [address]   Alcohol, Ethyl (B) 11/15/2020 <10  <10 mg/dL Final   Comment: (NOTE) Lowest detectable limit for serum alcohol is 10 mg/dL.  For medical purposes only. Performed at [facility], [address], [place], [place] [address]    Opiates 11/15/2020 NONE DETECTED  NONE DETECTED Final   Cocaine 11/15/2020 NONE DETECTED  NONE DETECTED Final   Benzodiazepines 11/15/2020 NONE DETECTED  NONE DETECTED Final   Amphetamines 11/15/2020 POSITIVE (A) NONE DETECTED Final   Tetrahydrocannabinol 11/15/2020 NONE DETECTED  NONE DETECTED Final   Barbiturates 11/15/2020 NONE DETECTED  NONE DETECTED Final   Comment: (NOTE) DRUG SCREEN FOR MEDICAL PURPOSES ONLY.  IF CONFIRMATION IS NEEDED FOR ANY PURPOSE, NOTIFY LAB WITHIN 5 DAYS.  LOWEST DETECTABLE LIMITS FOR URINE DRUG SCREEN Drug Class                     Cutoff (ng/mL) Amphetamine and metabolites    1000 Barbiturate and metabolites    200 Benzodiazepine                 200 Tricyclics and metabolites     300 Opiates  and metabolites        300 Cocaine and metabolites        300 THC                            50 Performed at [facility], [address], [place], [place] [address]    WBC 11/15/2020 6.6   4.0 - 10.5 K/uL Final   RBC 11/15/2020 4.76  4.22 - 5.81 MIL/uL Final   Hemoglobin 11/15/2020 14.6  13.0 - 17.0 g/dL Final   HCT 60/45/4098 44.7  39.0 - 52.0 % Final   MCV 11/15/2020 93.9  80.0 - 100.0 fL Final   MCH 11/15/2020 30.7  26.0 - 34.0 pg Final   MCHC 11/15/2020 32.7  30.0 - 36.0 g/dL Final   RDW 11/91/4782 13.2  11.5 - 15.5 % Final   Platelets 11/15/2020 153  150 - 400 K/uL Final   nRBC 11/15/2020 0.0  0.0 - 0.2 % Final   Neutrophils Relative % 11/15/2020 60  % Final   Neutro Abs 11/15/2020 3.9  1.7 - 7.7 K/uL Final   Lymphocytes Relative 11/15/2020 26  % Final   Lymphs Abs 11/15/2020 1.7  0.7 - 4.0 K/uL Final   Monocytes Relative 11/15/2020 12  % Final   Monocytes Absolute 11/15/2020 0.8  0.1 - 1.0 K/uL Final   Eosinophils Relative 11/15/2020 2  % Final   Eosinophils Absolute 11/15/2020 0.1  0.0 - 0.5 K/uL Final   Basophils Relative 11/15/2020 0  % Final   Basophils Absolute 11/15/2020 0.0  0.0 - 0.1 K/uL Final   Immature Granulocytes 11/15/2020 0  % Final   Abs Immature Granulocytes 11/15/2020 0.02  0.00 - 0.07 K/uL Final   Performed at [facility], [address], [place], [place] [address]   SARS Coronavirus 2 by RT PCR 11/16/2020 NEGATIVE  NEGATIVE Final   Comment: (NOTE) SARS-CoV-2 target nucleic acids are NOT DETECTED.  The SARS-CoV-2 RNA is generally detectable in upper respiratory specimens during the acute phase of infection. The lowest concentration of SARS-CoV-2 viral copies this assay can detect is 138 copies/mL. A negative result does not preclude SARS-Cov-2 infection and should not be used as the sole basis for treatment or other patient management decisions. A negative result may occur with  improper specimen collection/handling, submission of specimen other than nasopharyngeal swab, presence of viral mutation(s) within the areas targeted by this assay, and inadequate number of viral copies(<138 copies/mL). A negative result must be  combined with clinical observations, patient history, and epidemiological information. The expected result is Negative.  Fact Sheet for Patients:  BloggerCourse.com  Fact Sheet for Healthcare Providers:  SeriousBroker.it  This test is no                          t yet approved or cleared by the [place] FDA and  has been authorized for detection and/or diagnosis of SARS-CoV-2 by FDA under an Emergency Use Authorization (EUA). This EUA will remain  in effect (meaning this test can be used) for the duration of the COVID-19 declaration under Section 564(b)(1) of the Act, 21 U.S.C.section 360bbb-3(b)(1), unless the authorization is terminated  or revoked sooner.       Influenza A by PCR 11/16/2020 NEGATIVE  NEGATIVE Final   Influenza B by PCR 11/16/2020 NEGATIVE  NEGATIVE Final   Comment: (NOTE) The Xpert Xpress SARS-CoV-2/FLU/RSV plus assay  is intended as an aid in the diagnosis of influenza from Nasopharyngeal swab specimens and should not be used as a sole basis for treatment. Nasal washings and aspirates are unacceptable for Xpert Xpress SARS-CoV-2/FLU/RSV testing.  Fact Sheet for Patients: BloggerCourse.com  Fact Sheet for Healthcare Providers: SeriousBroker.it  This test is not yet approved or cleared by the [place] FDA and has been authorized for detection and/or diagnosis of SARS-CoV-2 by FDA under an Emergency Use Authorization (EUA). This EUA will remain in effect (meaning this test can be used) for the duration of the COVID-19 declaration under Section 564(b)(1) of the Act, 21 U.S.C. section 360bbb-3(b)(1), unless the authorization is terminated or revoked.  Performed at [facility], [address], [place], [place] [address]   Admission on 11/13/2020, Discharged on 11/14/2020  Component Date Value Ref Range Status   SARS Coronavirus  2 by RT PCR 11/13/2020 NEGATIVE  NEGATIVE Final   Comment: (NOTE) SARS-CoV-2 target nucleic acids are NOT DETECTED.  The SARS-CoV-2 RNA is generally detectable in upper respiratory specimens during the acute phase of infection. The lowest concentration of SARS-CoV-2 viral copies this assay can detect is 138 copies/mL. A negative result does not preclude SARS-Cov-2 infection and should not be used as the sole basis for treatment or other patient management decisions. A negative result may occur with  improper specimen collection/handling, submission of specimen other than nasopharyngeal swab, presence of viral mutation(s) within the areas targeted by this assay, and inadequate number of viral copies(<138 copies/mL). A negative result must be combined with clinical observations, patient history, and epidemiological information. The expected result is Negative.  Fact Sheet for Patients:  BloggerCourse.com  Fact Sheet for Healthcare Providers:  SeriousBroker.it  This test is no                          t yet approved or cleared by the [place] FDA and  has been authorized for detection and/or diagnosis of SARS-CoV-2 by FDA under an Emergency Use Authorization (EUA). This EUA will remain  in effect (meaning this test can be used) for the duration of the COVID-19 declaration under Section 564(b)(1) of the Act, 21 U.S.C.section 360bbb-3(b)(1), unless the authorization is terminated  or revoked sooner.       Influenza A by PCR 11/13/2020 NEGATIVE  NEGATIVE Final   Influenza B by PCR 11/13/2020 NEGATIVE  NEGATIVE Final   Comment: (NOTE) The Xpert Xpress SARS-CoV-2/FLU/RSV plus assay is intended as an aid in the diagnosis of influenza from Nasopharyngeal swab specimens and should not be used as a sole basis for treatment. Nasal washings and aspirates are unacceptable for Xpert Xpress SARS-CoV-2/FLU/RSV testing.  Fact Sheet for  Patients: BloggerCourse.com  Fact Sheet for Healthcare Providers: SeriousBroker.it  This test is not yet approved or cleared by the [place] FDA and has been authorized for detection and/or diagnosis of SARS-CoV-2 by FDA under an Emergency Use Authorization (EUA). This EUA will remain in effect (meaning this test can be used) for the duration of the COVID-19 declaration under Section 564(b)(1) of the Act, 21 U.S.C. section 360bbb-3(b)(1), unless the authorization is terminated or revoked.  Performed at [facility], [address] [place] [address], [place], [place] [address]    Sodium 11/13/2020 136  135 - 145 mmol/L Final   Potassium 11/13/2020 4.2  3.5 - 5.1 mmol/L Final   Chloride 11/13/2020 103  98 - 111 mmol/L Final   CO2 11/13/2020 27  22 - 32 mmol/L Final   Glucose, Bld 11/13/2020 92  70 - 99 mg/dL Final   Glucose reference range applies only to samples taken after fasting for at least 8 hours.   BUN 11/13/2020 14  6 - 20 mg/dL Final   Creatinine, Ser 11/13/2020 0.85  0.61 - 1.24 mg/dL Final   Calcium 95/28/4132 8.8 (A) 8.9 - 10.3 mg/dL Final   Total Protein 44/04/270 6.3 (A) 6.5 - 8.1 g/dL Final   Albumin 53/66/4403 3.6  3.5 - 5.0 g/dL Final   AST 47/42/5956 23  15 - 41 U/L Final   ALT 11/13/2020 40  0 - 44 U/L Final   Alkaline Phosphatase 11/13/2020 49  38 - 126 U/L Final   Total Bilirubin 11/13/2020 1.0  0.3 - 1.2 mg/dL Final   GFR, Estimated 11/13/2020 >60  >60 mL/min Final   Comment: (NOTE) Calculated using the CKD-EPI Creatinine Equation (2021)    Anion gap 11/13/2020 6  5 - 15 Final   Performed at [facility], [address], [place], [place] [address]   Alcohol, Ethyl (B) 11/13/2020 <10  <10 mg/dL Final   Comment: (NOTE) Lowest detectable limit for serum alcohol is 10 mg/dL.  For medical purposes only. Performed at [facility], [address], [place], [place] [address]    Opiates 11/13/2020 NONE  DETECTED  NONE DETECTED Final   Cocaine 11/13/2020 NONE DETECTED  NONE DETECTED Final   Benzodiazepines 11/13/2020 NONE DETECTED  NONE DETECTED Final   Amphetamines 11/13/2020 POSITIVE (A) NONE DETECTED Final   Tetrahydrocannabinol 11/13/2020 NONE DETECTED  NONE DETECTED Final   Barbiturates 11/13/2020 NONE DETECTED  NONE DETECTED Final   Comment: (NOTE) DRUG SCREEN FOR MEDICAL PURPOSES ONLY.  IF CONFIRMATION IS NEEDED FOR ANY PURPOSE, NOTIFY LAB WITHIN 5 DAYS.  LOWEST DETECTABLE LIMITS FOR URINE DRUG SCREEN Drug Class                     Cutoff (ng/mL) Amphetamine and metabolites    1000 Barbiturate and metabolites    200 Benzodiazepine                 200 Tricyclics and metabolites     300 Opiates and metabolites        300 Cocaine and metabolites        300 THC                            50 Performed at [facility], [address] [place], [place] [address]    WBC 11/13/2020 6.0  4.0 - 10.5 K/uL Final   RBC 11/13/2020 4.79  4.22 - 5.81 MIL/uL Final   Hemoglobin 11/13/2020 14.6  13.0 - 17.0 g/dL Final   HCT 16/60/6301 45.0  39.0 - 52.0 % Final   MCV 11/13/2020 93.9  80.0 - 100.0 fL Final   MCH 11/13/2020 30.5  26.0 - 34.0 pg Final   MCHC 11/13/2020 32.4  30.0 - 36.0 g/dL Final   RDW 60/01/9322 13.5  11.5 - 15.5 % Final   Platelets 11/13/2020 181  150 - 400 K/uL Final   nRBC 11/13/2020 0.0  0.0 - 0.2 % Final   Neutrophils Relative % 11/13/2020 53  % Final   Neutro Abs 11/13/2020 3.2  1.7 - 7.7 K/uL Final   Lymphocytes Relative 11/13/2020 30  % Final   Lymphs Abs 11/13/2020 1.8  0.7 - 4.0 K/uL  Final   Monocytes Relative 11/13/2020 14  % Final   Monocytes Absolute 11/13/2020 0.8  0.1 - 1.0 K/uL Final   Eosinophils Relative 11/13/2020 3  % Final   Eosinophils Absolute 11/13/2020 0.2  0.0 - 0.5 K/uL Final   Basophils Relative 11/13/2020 0  % Final   Basophils Absolute 11/13/2020 0.0  0.0 - 0.1 K/uL Final   Immature Granulocytes 11/13/2020 0  % Final   Abs Immature  Granulocytes 11/13/2020 0.01  0.00 - 0.07 K/uL Final   Performed at [facility], [address] [place], [place] [address]   Acetaminophen (Tylenol), Serum 11/13/2020 <10 (A) 10 - 30 ug/mL Final   Comment: (NOTE) Therapeutic concentrations vary significantly. A range of 10-30 ug/mL  may be an effective concentration for many patients. However, some  are best treated at concentrations outside of this range. Acetaminophen concentrations >150 ug/mL at 4 hours after ingestion  and >50 ug/mL at 12 hours after ingestion are often associated with  toxic reactions.  Performed at [facility], [address], [place], [place] [address]    Salicylate Lvl 11/13/2020 <7.0 (A) 7.0 - 30.0 mg/dL Final   Performed at [facility], [address] [place], [place] [address]  Admission on 10/21/2020, Discharged on 10/22/2020  Component Date Value Ref Range Status   Sodium 10/21/2020 137  135 - 145 mmol/L Final   Potassium 10/21/2020 3.4 (A) 3.5 - 5.1 mmol/L Final   Chloride 10/21/2020 101  98 - 111 mmol/L Final   CO2 10/21/2020 30  22 - 32 mmol/L Final   Glucose, Bld 10/21/2020 112 (A) 70 - 99 mg/dL Final   Glucose reference range applies only to samples taken after fasting for at least 8 hours.   BUN 10/21/2020 14  6 - 20 mg/dL Final   Creatinine, Ser 10/21/2020 0.99  0.61 - 1.24 mg/dL Final   Calcium 91/47/8295 8.9  8.9 - 10.3 mg/dL Final   Total Protein 62/13/0865 5.9 (A) 6.5 - 8.1 g/dL Final   Albumin 78/46/9629 3.4 (A) 3.5 - 5.0 g/dL Final   AST 52/84/1324 26  15 - 41 U/L Final   ALT 10/21/2020 34  0 - 44 U/L Final   Alkaline Phosphatase 10/21/2020 57  38 - 126 U/L Final   Total Bilirubin 10/21/2020 1.2  0.3 - 1.2 mg/dL Final   GFR, Estimated 10/21/2020 >60  >60 mL/min Final   Comment: (NOTE) Calculated using the CKD-EPI Creatinine Equation (2021)    Anion gap 10/21/2020 6  5 - 15 Final   Performed at [facility], [address], [place], [place] [address]    Alcohol, Ethyl (B) 10/21/2020 <10  <10 mg/dL Final   Comment: (NOTE) Lowest detectable limit for serum alcohol is 10 mg/dL.  For medical purposes only. Performed at [facility], [address] [place], [place] [address]    Salicylate Lvl 10/21/2020 <7.0 (A) 7.0 - 30.0 mg/dL Final   Performed at [facility], [address] [place], [place] [address]   Acetaminophen (Tylenol), Serum 10/21/2020 <10 (A) 10 - 30 ug/mL Final   Comment: (NOTE) Therapeutic concentrations vary significantly. A range of 10-30 ug/mL  may be an effective concentration for many patients. However, some  are best treated at concentrations outside of this range. Acetaminophen concentrations >150 ug/mL at 4 hours after ingestion  and >50 ug/mL at 12 hours after ingestion are often associated with  toxic reactions.  Performed at  [facility], [address], [place], [place] [address]    WBC 10/21/2020 8.2  4.0 - 10.5 K/uL Final   RBC 10/21/2020 4.54  4.22 - 5.81 MIL/uL Final   Hemoglobin 10/21/2020 13.9  13.0 - 17.0 g/dL Final   HCT 32/99/2426 42.5  39.0 - 52.0 % Final   MCV 10/21/2020 93.6  80.0 - 100.0 fL Final   MCH 10/21/2020 30.6  26.0 - 34.0 pg Final   MCHC 10/21/2020 32.7  30.0 - 36.0 g/dL Final   RDW 83/41/9622 13.1  11.5 - 15.5 % Final   Platelets 10/21/2020 183  150 - 400 K/uL Final   nRBC 10/21/2020 0.0  0.0 - 0.2 % Final   Performed at [facility], [address], [place], [place] [address]   Opiates 10/21/2020 NONE DETECTED  NONE DETECTED Final   Cocaine 10/21/2020 NONE DETECTED  NONE DETECTED Final   Benzodiazepines 10/21/2020 NONE DETECTED  NONE DETECTED Final   Amphetamines 10/21/2020 POSITIVE (A) NONE DETECTED Final   Tetrahydrocannabinol 10/21/2020 NONE DETECTED  NONE DETECTED Final   Barbiturates 10/21/2020 NONE DETECTED  NONE DETECTED Final   Comment: (NOTE) DRUG SCREEN FOR MEDICAL PURPOSES ONLY.  IF CONFIRMATION IS NEEDED FOR ANY PURPOSE, NOTIFY LAB WITHIN 5  DAYS.  LOWEST DETECTABLE LIMITS FOR URINE DRUG SCREEN Drug Class                     Cutoff (ng/mL) Amphetamine and metabolites    1000 Barbiturate and metabolites    200 Benzodiazepine                 200 Tricyclics and metabolites     300 Opiates and metabolites        300 Cocaine and metabolites        300 THC                            50 Performed at [facility], [address] [place], [place] [address]    SARS Coronavirus 2 by RT PCR 10/21/2020 NEGATIVE  NEGATIVE Final   Comment: (NOTE) SARS-CoV-2 target nucleic acids are NOT DETECTED.  The SARS-CoV-2 RNA is generally detectable in upper respiratory specimens during the acute phase of infection. The lowest concentration of SARS-CoV-2 viral copies this assay can detect is 138 copies/mL. A negative result does not preclude SARS-Cov-2 infection and should not be used as the sole basis for treatment or other patient management decisions. A negative result may occur with  improper specimen collection/handling, submission of specimen other than nasopharyngeal swab, presence of viral mutation(s) within the areas targeted by this assay, and inadequate number of viral copies(<138 copies/mL). A negative result must be combined with clinical observations, patient history, and epidemiological information. The expected result is Negative.  Fact Sheet for Patients:  BloggerCourse.com  Fact Sheet for Healthcare Providers:  SeriousBroker.it  This test is no                          t yet approved or cleared by the [place] FDA and  has been authorized for detection and/or diagnosis of SARS-CoV-2 by FDA under an Emergency Use Authorization (EUA). This EUA will remain  in effect (meaning this test can be used) for the duration of the COVID-19 declaration under Section 564(b)(1) of the Act, 21 U.S.C.section 360bbb-3(b)(1), unless the authorization is terminated  or  revoked  sooner.       Influenza A by PCR 10/21/2020 NEGATIVE  NEGATIVE Final   Influenza B by PCR 10/21/2020 NEGATIVE  NEGATIVE Final   Comment: (NOTE) The Xpert Xpress SARS-CoV-2/FLU/RSV plus assay is intended as an aid in the diagnosis of influenza from Nasopharyngeal swab specimens and should not be used as a sole basis for treatment. Nasal washings and aspirates are unacceptable for Xpert Xpress SARS-CoV-2/FLU/RSV testing.  Fact Sheet for Patients: BloggerCourse.com  Fact Sheet for Healthcare Providers: SeriousBroker.it  This test is not yet approved or cleared by the [place] FDA and has been authorized for detection and/or diagnosis of SARS-CoV-2 by FDA under an Emergency Use Authorization (EUA). This EUA will remain in effect (meaning this test can be used) for the duration of the COVID-19 declaration under Section 564(b)(1) of the Act, 21 U.S.C. section 360bbb-3(b)(1), unless the authorization is terminated or revoked.  Performed at [facility], [address] [place], [place] [address]   Admission on 10/07/2020, Discharged on 10/14/2020  Component Date Value Ref Range Status   Cholesterol 10/11/2020 150  0 - 200 mg/dL Final   Triglycerides 60/45/4098 173 (A) <150 mg/dL Final   HDL 11/91/4782 35 (A) >40 mg/dL Final   Total CHOL/HDL Ratio 10/11/2020 4.3  RATIO Final   VLDL 10/11/2020 35  0 - 40 mg/dL Final   LDL Cholesterol 10/11/2020 80  0 - 99 mg/dL Final   Comment:        Total Cholesterol/HDL:CHD Risk Coronary Heart Disease Risk Table                     Men   Women  1/2 Average Risk   3.4   3.3  Average Risk       5.0   4.4  2 X Average Risk   9.6   7.1  3 X Average Risk  23.4   11.0        Use the calculated Patient Ratio above and the CHD Risk Table to determine the patient's CHD Risk.        ATP III CLASSIFICATION (LDL):  <100     mg/dL   Optimal  956-213  mg/dL   Near or Above                     Optimal  130-159  mg/dL   Borderline  086-578  mg/dL   High  >469     mg/dL   Very High Performed at [facility], [address] [place], [place] [address]    Hgb A1c MFr Bld 10/11/2020 5.8 (A) 4.8 - 5.6 % Final   Comment: (NOTE)         Prediabetes: 5.7 - 6.4         Diabetes: >6.4         Glycemic control for adults with diabetes: <7.0    Mean Plasma Glucose 10/11/2020 120  mg/dL Final   Comment: (NOTE) Performed At: [facility] [address] [place], [place] [id number] Jolene Schimke MD UU:[id number]    TSH 10/11/2020 1.096  0.350 - 4.500 uIU/mL Final   Comment: Performed by a 3rd Generation assay with a functional sensitivity of <=0.01 uIU/mL. Performed at [facility], [address], [place], [place] [address]   Admission on 10/06/2020, Discharged on 10/07/2020  Component Date Value Ref Range Status   WBC 10/06/2020 6.7  4.0 - 10.5 K/uL Final  RBC 10/06/2020 4.47  4.22 - 5.81 MIL/uL Final   Hemoglobin 10/06/2020 13.6  13.0 - 17.0 g/dL Final   HCT 16/01/9603 42.3  39.0 - 52.0 % Final   MCV 10/06/2020 94.6  80.0 - 100.0 fL Final   MCH 10/06/2020 30.4  26.0 - 34.0 pg Final   MCHC 10/06/2020 32.2  30.0 - 36.0 g/dL Final   RDW 54/12/8117 12.7  11.5 - 15.5 % Final   Platelets 10/06/2020 169  150 - 400 K/uL Final   nRBC 10/06/2020 0.0  0.0 - 0.2 % Final   Performed at [facility], [address], [place], [place] [address]   Sodium 10/06/2020 139  135 - 145 mmol/L Final   Potassium 10/06/2020 3.3 (A) 3.5 - 5.1 mmol/L Final   Chloride 10/06/2020 105  98 - 111 mmol/L Final   CO2 10/06/2020 28  22 - 32 mmol/L Final   Glucose, Bld 10/06/2020 114 (A) 70 - 99 mg/dL Final   Glucose reference range applies only to samples taken after fasting for at least 8 hours.   BUN 10/06/2020 18  6 - 20 mg/dL Final   Creatinine, Ser 10/06/2020 1.00  0.61 - 1.24 mg/dL Final   Calcium 95/62/1308 8.9  8.9 - 10.3 mg/dL Final   Total Protein  10/06/2020 6.0 (A) 6.5 - 8.1 g/dL Final   Albumin 65/78/4696 3.4 (A) 3.5 - 5.0 g/dL Final   AST 29/52/8413 19  15 - 41 U/L Final   ALT 10/06/2020 25  0 - 44 U/L Final   Alkaline Phosphatase 10/06/2020 51  38 - 126 U/L Final   Total Bilirubin 10/06/2020 0.2 (A) 0.3 - 1.2 mg/dL Final   GFR, Estimated 10/06/2020 >60  >60 mL/min Final   Comment: (NOTE) Calculated using the CKD-EPI Creatinine Equation (2021)    Anion gap 10/06/2020 6  5 - 15 Final   Performed at [facility], [address] [place], [place] [address]   Alcohol, Ethyl (B) 10/06/2020 <10  <10 mg/dL Final   Comment: (NOTE) Lowest detectable limit for serum alcohol is 10 mg/dL.  For medical purposes only. Performed at [facility], [address], [place], [place] [address]    Opiates 10/06/2020 NONE DETECTED  NONE DETECTED Final   Cocaine 10/06/2020 NONE DETECTED  NONE DETECTED Final   Benzodiazepines 10/06/2020 NONE DETECTED  NONE DETECTED Final   Amphetamines 10/06/2020 POSITIVE (A) NONE DETECTED Final   Tetrahydrocannabinol 10/06/2020 NONE DETECTED  NONE DETECTED Final   Barbiturates 10/06/2020 NONE DETECTED  NONE DETECTED Final   Comment: (NOTE) DRUG SCREEN FOR MEDICAL PURPOSES ONLY.  IF CONFIRMATION IS NEEDED FOR ANY PURPOSE, NOTIFY LAB WITHIN 5 DAYS.  LOWEST DETECTABLE LIMITS FOR URINE DRUG SCREEN Drug Class                     Cutoff (ng/mL) Amphetamine and metabolites    1000 Barbiturate and metabolites    200 Benzodiazepine                 200 Tricyclics and metabolites     300 Opiates and metabolites        300 Cocaine and metabolites        300 THC                            50 Performed at [facility], [address] [place], [place] [address]  Salicylate Lvl 10/06/2020 <7.0 (A) 7.0 - 30.0 mg/dL Final   Performed at [facility], [address], [place], [place] [address]   Acetaminophen (Tylenol), Serum 10/06/2020 <10 (A) 10 - 30 ug/mL Final   Performed at [facility], [address], [place], [place] [address]   SARS Coronavirus 2 by RT PCR 10/07/2020 NEGATIVE  NEGATIVE Final   Comment: (NOTE) SARS-CoV-2 target nucleic acids are NOT DETECTED.  The SARS-CoV-2 RNA is generally detectable in upper respiratory specimens during the acute phase of infection. The lowest concentration of SARS-CoV-2 viral copies this assay can detect is 138 copies/mL. A negative result does not preclude SARS-Cov-2 infection and should not be used as the sole basis for treatment or other patient management decisions. A negative result may occur with  improper specimen collection/handling, submission of specimen other than nasopharyngeal swab, presence of viral mutation(s) within the areas targeted by this assay, and inadequate number of viral copies(<138 copies/mL). A negative result must be combined with clinical observations, patient history, and epidemiological information. The expected result is Negative.  Fact Sheet for Patients:  BloggerCourse.com  Fact Sheet for Healthcare Providers:  SeriousBroker.it  This test is no                          t yet approved or cleared by the [place] FDA and  has been authorized for detection and/or diagnosis of SARS-CoV-2 by FDA under an Emergency Use Authorization (EUA). This EUA will remain  in effect (meaning this test can be used) for the duration of the COVID-19 declaration under Section 564(b)(1) of the Act, 21 U.S.C.section 360bbb-3(b)(1), unless the authorization is terminated  or revoked sooner.       Influenza A by PCR 10/07/2020 NEGATIVE  NEGATIVE Final   Influenza B by PCR 10/07/2020 NEGATIVE  NEGATIVE Final   Comment: (NOTE) The Xpert Xpress SARS-CoV-2/FLU/RSV plus assay is intended as an aid in the diagnosis of influenza from Nasopharyngeal swab specimens and should not be used as a sole basis for treatment. Nasal washings and aspirates are  unacceptable for Xpert Xpress SARS-CoV-2/FLU/RSV testing.  Fact Sheet for Patients: BloggerCourse.com  Fact Sheet for Healthcare Providers: SeriousBroker.it  This test is not yet approved or cleared by the [place] FDA and has been authorized for detection and/or diagnosis of SARS-CoV-2 by FDA under an Emergency Use Authorization (EUA). This EUA will remain in effect (meaning this test can be used) for the duration of the COVID-19 declaration under Section 564(b)(1) of the Act, 21 U.S.C. section 360bbb-3(b)(1), unless the authorization is terminated or revoked.  Performed at [facility], [address] [place], [place] [address]   Admission on 08/19/2020, Discharged on 08/20/2020  Component Date Value Ref Range Status   Sodium 08/19/2020 140  135 - 145 mmol/L Final   Potassium 08/19/2020 4.3  3.5 - 5.1 mmol/L Final   Chloride 08/19/2020 105  98 - 111 mmol/L Final   CO2 08/19/2020 27  22 - 32 mmol/L Final   Glucose, Bld 08/19/2020 95  70 - 99 mg/dL Final   Glucose reference range applies only to samples taken after fasting for at least 8 hours.   BUN 08/19/2020 13  6 - 20 mg/dL Final   Creatinine, Ser 08/19/2020 0.94  0.61 - 1.24 mg/dL Final   Calcium 53/61/4431 9.6  8.9 - 10.3 mg/dL Final   Total Protein 54/00/8676 6.7  6.5 - 8.1 g/dL  Final   Albumin 08/19/2020 3.9  3.5 - 5.0 g/dL Final   AST 46/96/2952 35  15 - 41 U/L Final   ALT 08/19/2020 61 (A) 0 - 44 U/L Final   Alkaline Phosphatase 08/19/2020 64  38 - 126 U/L Final   Total Bilirubin 08/19/2020 0.8  0.3 - 1.2 mg/dL Final   GFR, Estimated 08/19/2020 >60  >60 mL/min Final   Comment: (NOTE) Calculated using the CKD-EPI Creatinine Equation (2021)    Anion gap 08/19/2020 8  5 - 15 Final   Performed at [facility], [address], [place], [place] [address]   WBC 08/19/2020 8.0  4.0 - 10.5 K/uL Final   RBC 08/19/2020 5.28  4.22 - 5.81 MIL/uL Final    Hemoglobin 08/19/2020 15.9  13.0 - 17.0 g/dL Final   HCT 44/04/270 49.4  39.0 - 52.0 % Final   MCV 08/19/2020 93.6  80.0 - 100.0 fL Final   MCH 08/19/2020 30.1  26.0 - 34.0 pg Final   MCHC 08/19/2020 32.2  30.0 - 36.0 g/dL Final   RDW 53/66/4403 12.7  11.5 - 15.5 % Final   Platelets 08/19/2020 197  150 - 400 K/uL Final   nRBC 08/19/2020 0.0  0.0 - 0.2 % Final   Neutrophils Relative % 08/19/2020 64  % Final   Neutro Abs 08/19/2020 5.1  1.7 - 7.7 K/uL Final   Lymphocytes Relative 08/19/2020 21  % Final   Lymphs Abs 08/19/2020 1.7  0.7 - 4.0 K/uL Final   Monocytes Relative 08/19/2020 13  % Final   Monocytes Absolute 08/19/2020 1.0  0.1 - 1.0 K/uL Final   Eosinophils Relative 08/19/2020 2  % Final   Eosinophils Absolute 08/19/2020 0.1  0.0 - 0.5 K/uL Final   Basophils Relative 08/19/2020 0  % Final   Basophils Absolute 08/19/2020 0.0  0.0 - 0.1 K/uL Final   Immature Granulocytes 08/19/2020 0  % Final   Abs Immature Granulocytes 08/19/2020 0.03  0.00 - 0.07 K/uL Final   Performed at [facility], [address] [place], [place] [address]   Alcohol, Ethyl (B) 08/19/2020 <10  <10 mg/dL Final   Comment: (NOTE) Lowest detectable limit for serum alcohol is 10 mg/dL.  For medical purposes only. Performed at [facility], [address] [place], [place] [address]    Opiates 08/19/2020 NONE DETECTED  NONE DETECTED Final   Cocaine 08/19/2020 NONE DETECTED  NONE DETECTED Final   Benzodiazepines 08/19/2020 NONE DETECTED  NONE DETECTED Final   Amphetamines 08/19/2020 NONE DETECTED  NONE DETECTED Final   Tetrahydrocannabinol 08/19/2020 NONE DETECTED  NONE DETECTED Final   Barbiturates 08/19/2020 NONE DETECTED  NONE DETECTED Final   Comment: (NOTE) DRUG SCREEN FOR MEDICAL PURPOSES ONLY.  IF CONFIRMATION IS NEEDED FOR ANY PURPOSE, NOTIFY LAB WITHIN 5 DAYS.  LOWEST DETECTABLE LIMITS FOR URINE DRUG SCREEN Drug Class                     Cutoff (ng/mL) Amphetamine and metabolites     1000 Barbiturate and metabolites    200 Benzodiazepine                 200 Tricyclics and metabolites     300 Opiates and metabolites        300 Cocaine and metabolites        300 THC  50 Performed at [facility], [address] [place], [place] [address]    SARS Coronavirus 2 by RT PCR 08/19/2020 NEGATIVE  NEGATIVE Final   Comment: (NOTE) SARS-CoV-2 target nucleic acids are NOT DETECTED.  The SARS-CoV-2 RNA is generally detectable in upper respiratory specimens during the acute phase of infection. The lowest concentration of SARS-CoV-2 viral copies this assay can detect is 138 copies/mL. A negative result does not preclude SARS-Cov-2 infection and should not be used as the sole basis for treatment or other patient management decisions. A negative result may occur with  improper specimen collection/handling, submission of specimen other than nasopharyngeal swab, presence of viral mutation(s) within the areas targeted by this assay, and inadequate number of viral copies(<138 copies/mL). A negative result must be combined with clinical observations, patient history, and epidemiological information. The expected result is Negative.  Fact Sheet for Patients:  BloggerCourse.com  Fact Sheet for Healthcare Providers:  SeriousBroker.it  This test is no                          t yet approved or cleared by the [place] FDA and  has been authorized for detection and/or diagnosis of SARS-CoV-2 by FDA under an Emergency Use Authorization (EUA). This EUA will remain  in effect (meaning this test can be used) for the duration of the COVID-19 declaration under Section 564(b)(1) of the Act, 21 U.S.C.section 360bbb-3(b)(1), unless the authorization is terminated  or revoked sooner.       Influenza A by PCR 08/19/2020 NEGATIVE  NEGATIVE Final   Influenza B by PCR 08/19/2020 NEGATIVE  NEGATIVE Final    Comment: (NOTE) The Xpert Xpress SARS-CoV-2/FLU/RSV plus assay is intended as an aid in the diagnosis of influenza from Nasopharyngeal swab specimens and should not be used as a sole basis for treatment. Nasal washings and aspirates are unacceptable for Xpert Xpress SARS-CoV-2/FLU/RSV testing.  Fact Sheet for Patients: BloggerCourse.com  Fact Sheet for Healthcare Providers: SeriousBroker.it  This test is not yet approved or cleared by the [place] FDA and has been authorized for detection and/or diagnosis of SARS-CoV-2 by FDA under an Emergency Use Authorization (EUA). This EUA will remain in effect (meaning this test can be used) for the duration of the COVID-19 declaration under Section 564(b)(1) of the Act, 21 U.S.C. section 360bbb-3(b)(1), unless the authorization is terminated or revoked.  Performed at [facility], [address], [place], [place] [address]     Allergies: Patient has no known allergies.  PTA Medications: (Not in a hospital admission)   Medical Decision Making  Tristan Valdez is a 50 y/o male with a history of major depressive disorder, suicidal ideation and substance-induced mood disorder with a plan to overdose on fentanyl. Patient's chart was reviewed.    Recommendations  Based on my evaluation the patient does not appear to have an emergency medical condition.Patient meets the criteria for continuous assessment at [facility] and be reassessed later today.  Jasper Riling, NP 11/16/20  5:02 AM

## 2020-11-16 NOTE — ED Notes (Signed)
Educated pt on d/c and f/u instructions. Verbalized understanding. However did state "you will hear my attorney". Ambulated per self to retrieve belongings. No SI, HI, or AVH noted. No s/s pain, discomfort, or acute distress noted. Escorted to front lobby and d/c with bus pass for transportation. Stable at time of d/c
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
## 2020-11-16 NOTE — Discharge Instructions (Addendum)
Go to [facility]); Open access walk in hours. Mon to Thursday 8 am to 11 am. Please  come at 7.45 am., As needed for medication management and therapy.  Follow up with resources provided for substance abuse and housing.  Take all of you medications as prescribed by your mental healthcare provider. Report any adverse effects and reactions from your medications to your outpatient provider promptly.  Do not engage in alcohol and or illegal drug use while on prescription medicines.  Keep all scheduled appointments. This is to ensure that you are getting refills on time and to avoid any interruption in your medication.  If you are unable to keep an appointment call to reschedule.  Be sure to follow up with resources and follow ups given.  In the event of worsening symptoms call the crisis hotline, 911, and or go to the nearest emergency department for appropriate evaluation and treatment of symptoms.  Follow-up with your primary care provider for your medical issues, concerns and or health care needs.       Homeless Shelter List:   [facility]) [address] [place], [place] Phone: [phone]   [facility] has been providing emergency shelter to those in need of a permanent residence for over 35 years. The [employer] shelter plays an important role in our community.   There are many life events that can pull someone into a downward spiral towards poverty that is very difficult to get out of. Homelessness is a problem that can affect anyone of [place]. [employer] is a safe and comforting place to stay, especially if you have experienced the hardship of street life.   [employer] provides a single bed and bedding to 100 adult men and women. The shelter welcomes all who are in need of housing, no one in real need is turned away unless space is not available.   While staying at  [facility], guests are offered more than just a bed for a night. Hot meals are provided and every guest has access to case management services. Case managers provide assistance with finding housing, employment, or other services that will help them gain stability. Continuous stay is based on availability, capacity, and progress towards goals.   To contact the front desk of [facility] please call   [phone] ext [phone] or ext. [phone].   [facility] [address], [place], [place] [address] Phone: [phone]   [facility] (Women only) [address] [place], [place] [address] Phone: [phone]   [facility] [address], [place] [address] Phone: [phone]   [facility]: [phone]. [address] [place], [place] [address] Phone: [phone]   [facility] [address] [place], [place] [address] Check in at 6:00PM for placement at a local shelter) Phone: [phone]   [facility] - Admissions are currently completed Monday through Friday at 8am; both appointments and walk-ins are accepted.  Any individual that is a [facility] resident may present for a substance abuse screening and assessment for admission.  A person may be referred by numerous sources or self-refer.   Potential clients will be screened for medical necessity and appropriateness for the program.  Clients must meet criteria for high-intensity residential treatment services.  If clinically appropriate, a client will continue with the comprehensive clinical assessment and intake process,  as well as enrollment in the [facility].   Address: [address] [place], [place] [address] Admin Hours: Mon-Fri 8AM to [facility] Hours: 24/7 Phone: [phone] Fax: [phone]   [facility]) Facility Based Crisis: These are 3 locations for services: Please  call before arrival   Address: [address] [place], [place] [address] Phone: [phone]   Address: [address] [place], [place] [address] Phone#: [phone]   Address: [address] [place], [place] [address] Phone#: [phone]     [facility]): (offers outpatient therapy and intensive outpatient substance abuse therapy). [address], [place], [place] [address] Phone: [phone]   [facility]: Offers FREE recovery skills classes, support groups, 1:1 Peer Support, and Compeer Classes. [address], [place], [place] [address] Phone: [phone] (Call to complete intake). [facility] [address] [place], [place] [address] Phone: [phone] ext: [phone] The [facility] provides food, shelter and other programs and services to the homeless men of [place] through our [employer].   By offering safe shelter, three meals a day, clean clothing, Biblical counseling, financial planning, vocational training, GED/education and employment assistance, we've helped mend the shattered lives of many homeless men since opening in 1974.   We have approximately 267 beds available, with a max of 312 beds including mats for emergency situations and currently house an average of 270 men a night.   Prospective Client Check-In Information Photo ID Required (State/ Out of State/ [facility]) - if photo ID is not available, clients are required to have a printout of a police/sheriff's criminal history report. Help out with chores around the Mission. No sex offender of any type (pending, charged, registered and/or any other sex related offenses) will be permitted to check in. Must be willing to abide by all rules, regulations, and policies established by the [employer]. The following will be provided - shelter, food, clothing, and biblical counseling. If you or someone you know is  in need of assistance at our [facility] shelter in [place], [place], please call [phone] ext. [phone].   [facility]-will provide timely access to mental health services for children and adolescents (4-17) and adults presenting in a mental health crisis. The program is designed for those who need urgent Behavioral Health or Substance Use treatment and are not experiencing a medical crisis that would typically require an emergency room visit.   [address] [place], [place] [address] Phone: [phone] [email or web address]   [facility]: Phone#: [phone]   The [facility] SA Intensive Outpatient Program (SAIOP) means structured individual and group addiction activities and services that are provided at an outpatient program designed to assist adult and adolescent consumers to begin recovery and learn skills for recovery maintenance. The [employer] SAIOP program is offered at least 3 hours a day, 3 days a week.SAIOP services shall include a structured program consisting of, but not limited to, the following services: Individual counseling and support; Group counseling and support; Family counseling, training or support; Biochemical assays to identify recent drug use (e.g., urine drug screens); Strategies for relapse prevention to include community and social support systems in treatment; Life skills; Crisis contingency planning; Disease Management; and Treatment support activities that have been adapted or specifically designed for persons with physical disabilities, or persons with co-occurring disorders of mental illness and substance abuse/dependence or mental retardation/developmental disability and  substance abuse/dependence. Phone: [phone]  Address:   The [facility] will also offer the following outpatient services: (Monday through Friday 8am-5pm)   Partial Hospitalization Program (PHP) Substance Abuse Intensive  Outpatient Program (SA-IOP) Group Therapy Medication Management Peer Living Room We also provide (24/7): Assessments: Our mental health clinician and providers will conduct a focused mental health evaluation, assessing for immediate safety concerns and further mental health needs. Referral: Our team will provide resources and help connect to community based mental health treatment, when indicated, including psychotherapy, psychiatry, and other specialized behavioral health or substance use disorder services (for those not already in treatment). Transitional Care: Our team providers in person bridging and/or telephonic follow-up during the patient's transition to outpatient services. The [facility] 24-Hour Call Center: [phone] Behavioral Health Crisis Line: [phone]

## 2020-11-16 NOTE — ED Notes (Signed)
Pt escorted to [facility] transport vehicle. Pt belongings and voluntary form given to [facility] transport.

## 2020-11-16 NOTE — ED Provider Notes (Signed)
FBC/OBS ASAP Discharge Summary  Date and Time: 11/16/2020 2:06 PM  Name: Tristan Valdez  MRN:  [id number]   Discharge Diagnoses:  Final diagnoses:  MDD (major depressive disorder), recurrent severe, without psychosis (HCC)    Subjective: Patient states, I need to get into a program. [place] need to get to [place]. I don't want to go [place] or [place]. If I can find a place to go, do you understand that I will kill myself."   Stay Summary: Patient seen and examined face to face again for the second time in less than 24 hours for similar presentation. He was seen on 11/15/20 [facility] and declined resources and demanded to leave. He then [facility] later on that night reporting SI and homelessness and then [facility] early morning on 11/16/20. Today, patient is alert and oriented x4. He is irritable, threatening and demanding. His thought process is logical and speech is coherent. He is focused on trying to find transportation to [place] for possible outpatient treatment that he has arranged with a person by the name of Tristan Valdez (please see below for previous details). He was advised to contact Marlena [facility] to see if she could possibly provide transportation for him to get to [place]. He states that he will not contact her because she is unable to provide transportation for him to get to [place]. He was advised that social work is unable to provide transportation to [place]. He demanded getting transportation to [place], or admitted to a residential facility of his choosing, or he will kill himself. He appears to have secondary gain due to being homeless and seeking housing. He only threatens suicide when he is unable to get his way. He has been provided with numerous resources for housing options, outpatient resources, and offered substance abuse treatment, but declines the stated  resources because it's not what he prefers.   Per chart review:  [facility]  and [facility]: Per [facility] 11/16/20: Patient states that if he is discharged today without transportation arranged for him to go to the facility in [place] that he plans to overdose on fentanyl.  Patient states that he is not interested in any other options and that the [place] program is not a good program because it will just got require him to work  11/15/20: Social work contacted to assist with follow up resources: Per LCSW, [employer]ed Marlena in reference to possible placement at a facility in [place], [place].  It was reported that she plans on getting the patient in an outpatient treatment center called [facility] located at [address] [place], [place] [address]/Phone#: [phone]. It shows that the facility is only operational during the week days from 8am-5pm. Tristan Valdez advised that she does not have a way for the patient to get to [place], however can pay for a taxi to her residence from the bus station down there, if he is able to caught a bus [place]. Tristan Valdez also advised on plans to possible getting the patient into a inpatient program in [place]. Call [facility] [phone] to do an phone assessment or attempt to get into [facility].    This provider discussed the stated resources with the patient. He states that he cannot get to [place]. He declined the other two options and demanded to leave.   Total Time spent  with patient: 30 minutes  Past Psychiatric History: MDD and Substance induced mood dx.  Past Medical History:  Past Medical History:  Diagnosis Date   Arthritis    Bilateral club feet    Hypertension     Past Surgical History:  Procedure Laterality Date   APPENDECTOMY     reconstruction of tendons of left arm Left    Family History:  Family History  Problem Relation Age of Onset   CAD Mother    CAD Father     Family Psychiatric History: Unknown Social History:  Social History   Substance and Sexual Activity  Alcohol Use Never     Social History   Substance and Sexual Activity  Drug Use Not Currently   Types: Methamphetamines   Comment: Fentanyl    Social History   Socioeconomic History   Marital status: Divorced    Spouse name: Not on file   Number of children: Not on file   Years of education: Not on file   Highest education level: Not on file  Occupational History   Not on file  Tobacco Use   Smoking status: Every Day    Packs/day: 1.00    Types: Cigarettes   Smokeless tobacco: Never   Tobacco comments:    Last used: 2 days ago  Vaping Use   Vaping Use: Some days  Substance and Sexual Activity   Alcohol use: Never   Drug use: Not Currently    Types: Methamphetamines    Comment: Fentanyl   Sexual activity: Not Currently  Other Topics Concern   Not on file  Social History Narrative   Not on file   Social Determinants of Health   Financial Resource Strain: Not on file  Food Insecurity: Not on file  Transportation Needs: Not on file  Physical Activity: Not on file  Stress: Not on file  Social Connections: Not on file   SDOH:  SDOH Screenings   Alcohol Screen: Low Risk    Last Alcohol Screening Score (AUDIT): 0  Depression (PHQ2-9): Not on file  Financial Resource Strain: Not on file  Food Insecurity: Not on file  Housing: Not on file  Physical Activity: Not on file  Social Connections: Not on file  Stress: Not on file  Tobacco Use: High Risk   Smoking Tobacco Use: Every Day   Smokeless Tobacco Use: Never  Transportation Needs: Not on file    Tobacco Cessation:  Prescription not provided because: provided on 11/15/20  Current Medications:  Current Facility-Administered Medications  Medication Dose Route Frequency Provider Last Rate Last Admin   acetaminophen (TYLENOL) tablet 650 mg  650 mg Oral Q6H PRN Bobbitt, Shalon E, NP       alum & mag  hydroxide-simeth (MAALOX/MYLANTA) 200-200-20 MG/5ML suspension 30 mL  30 mL Oral Q4H PRN Bobbitt, Shalon E, NP       hydrOXYzine (ATARAX/VISTARIL) tablet 25 mg  25 mg Oral TID PRN Bobbitt, Shalon E, NP       magnesium hydroxide (MILK OF MAGNESIA) suspension 30 mL  30 mL Oral Daily PRN Bobbitt, Shalon E, NP       Current Outpatient Medications  Medication Sig Dispense Refill   hydrOXYzine (ATARAX/VISTARIL) 25 MG tablet Take 1 tablet (25 mg total) by mouth 3 (three) times daily as needed for anxiety. 30 tablet 0   mirtazapine (REMERON) 15 MG tablet Take 1 tablet (15 mg total) by mouth at bedtime. 30 tablet 0   nicotine (NICODERM CQ - DOSED IN MG/24  HOURS) 14 mg/24hr patch Place 1 patch (14 mg total) onto the skin daily. 28 patch 0    PTA Medications: (Not in a hospital admission)   Musculoskeletal  Strength & Muscle Tone: within normal limits Gait & Station: normal Patient leans: N/A  Psychiatric Specialty Exam  Presentation  General Appearance: Appropriate for Environment  Eye Contact:Fair  Speech:Clear and Coherent  Speech Volume:Normal  Handedness:Right   Mood and Affect  Mood:Irritable  Affect:Blunt   Thought Process  Thought Processes:Coherent  Descriptions of Associations:Intact  Orientation:Full (Time, Place and Person)  Thought Content:WDL  Diagnosis of Schizophrenia or Schizoaffective disorder in past: No  Duration of Psychotic Symptoms: Less than six months   Hallucinations:Hallucinations: None  Ideas of Reference:None  Suicidal Thoughts:Suicidal Thoughts: Yes, Passive SI Active Intent and/or Plan: With Intent; With Plan  Homicidal Thoughts:Homicidal Thoughts: No   Sensorium  Memory:Immediate Fair; Recent Fair; Remote Fair  Judgment:Poor  Insight:Lacking   Executive Functions  Concentration:Fair  Attention Span:Fair  Recall:Fair  Fund of Knowledge:Fair  Language:Fair   Psychomotor Activity  Psychomotor Activity:Psychomotor  Activity: Normal   Assets  Assets:Communication Skills; Desire for Improvement; Physical Health; Leisure Time   Sleep  Sleep:Sleep: Fair Number of Hours of Sleep: -1   Nutritional Assessment (For OBS and FBC admissions only) Has the patient had a weight loss or gain of 10 pounds or more in the last 3 months?: No Has the patient had a decrease in food intake/or appetite?: No Does the patient have dental problems?: No Does the patient have eating habits or behaviors that may be indicators of an eating disorder including binging or inducing vomiting?: No Has the patient recently lost weight without trying?: No Has the patient been eating poorly because of a decreased appetite?: No Malnutrition Screening Tool Score: 0    Physical Exam  Physical Exam HENT:     Head: Normocephalic.  Eyes:     Conjunctiva/sclera: Conjunctivae normal.  Cardiovascular:     Rate and Rhythm: Bradycardia present.  Pulmonary:     Effort: Pulmonary effort is normal.  Musculoskeletal:     Cervical back: Normal range of motion.  Neurological:     General: No focal deficit present.     Mental Status: He is alert and oriented to person, place, and time.   Review of Systems  Constitutional: Negative.   HENT: Negative.    Eyes: Negative.   Respiratory: Negative.    Cardiovascular: Negative.   Gastrointestinal: Negative.   Genitourinary: Negative.   Musculoskeletal: Negative.   Skin: Negative.   Neurological: Negative.   Endo/Heme/Allergies: Negative.   Psychiatric/Behavioral:  Positive for substance abuse and suicidal ideas.   Blood pressure 129/89, pulse (!) 58, temperature 98.6 F (37 C), temperature source Oral, resp. rate 16, SpO2 100 %. There is no height or weight on file to calculate BMI.  Demographic Factors:  Male, Caucasian, Low socioeconomic status, Living alone, and Unemployed  Loss Factors: Financial problems/change in socioeconomic status  Historical Factors: Prior suicide  attempts and Impulsivity  Risk Reduction Factors:   Religious beliefs about death  Continued Clinical Symptoms:  Alcohol/Substance Abuse/Dependencies  Cognitive Features That Contribute To Risk:  None    Suicide Risk:  Minimal: No identifiable suicidal ideation.  Patients presenting with no risk factors but with morbid ruminations; may be classified as minimal risk based on the severity of the depressive symptoms  Plan Of Care/Follow-up recommendations:  Activity:  as tolerated   Disposition: Patient does not meet criteria for inpatient treatment and  can benefit from outpatient resources and treatment. Follow up with stated plan at [facility] in [place]. Follow up at the [facility] outpatient as an alternative for medication management and therapy. Follow up with substance abuse resources. Prescriptions were provided on 11/15/20.    Helvi Royals L, NP 11/16/2020, 2:06 PM

## 2020-11-16 NOTE — ED Notes (Signed)
Locker #[id number] 

## 2020-11-17 ENCOUNTER — Other Ambulatory Visit: Payer: Self-pay

## 2020-11-17 ENCOUNTER — Ambulatory Visit (HOSPITAL_COMMUNITY)
Admission: EM | Admit: 2020-11-17 | Discharge: 2020-11-18 | Disposition: A | Discharge status: Home / Self Care | Payer: No Typology Code available for payment source | Attending: Student | Admitting: Student

## 2020-11-17 DIAGNOSIS — F1520 Other stimulant dependence, uncomplicated: Secondary | ICD-10-CM | POA: Insufficient documentation

## 2020-11-17 DIAGNOSIS — Z20822 Contact with and (suspected) exposure to covid-19: Secondary | ICD-10-CM | POA: Diagnosis not present

## 2020-11-17 DIAGNOSIS — F17210 Nicotine dependence, cigarettes, uncomplicated: Secondary | ICD-10-CM | POA: Insufficient documentation

## 2020-11-17 DIAGNOSIS — R45851 Suicidal ideations: Secondary | ICD-10-CM

## 2020-11-17 DIAGNOSIS — F1110 Opioid abuse, uncomplicated: Secondary | ICD-10-CM | POA: Diagnosis not present

## 2020-11-17 DIAGNOSIS — F332 Major depressive disorder, recurrent severe without psychotic features: Secondary | ICD-10-CM | POA: Insufficient documentation

## 2020-11-17 DIAGNOSIS — F1994 Other psychoactive substance use, unspecified with psychoactive substance-induced mood disorder: Secondary | ICD-10-CM | POA: Diagnosis not present

## 2020-11-17 DIAGNOSIS — F1114 Opioid abuse with opioid-induced mood disorder: Secondary | ICD-10-CM | POA: Diagnosis not present

## 2020-11-17 DIAGNOSIS — Z8249 Family history of ischemic heart disease and other diseases of the circulatory system: Secondary | ICD-10-CM | POA: Diagnosis not present

## 2020-11-17 MED ORDER — MIRTAZAPINE 15 MG PO TABS
15.0000 mg | ORAL_TABLET | Freq: Every day | ORAL | Status: DC
  Administered 2020-11-18: 15 mg via ORAL
  Filled 2020-11-17: qty 1

## 2020-11-17 MED ORDER — HYDROXYZINE HCL 25 MG PO TABS: 25.0000 mg | ORAL_TABLET | Freq: Three times a day (TID) | ORAL | Status: DC | PRN

## 2020-11-17 MED ORDER — ALUM & MAG HYDROXIDE-SIMETH 200-200-20 MG/5ML PO SUSP: 30.0000 mL | ORAL | Status: DC | PRN

## 2020-11-17 MED ORDER — TRAZODONE HCL 50 MG PO TABS
50.0000 mg | ORAL_TABLET | Freq: Every evening | ORAL | Status: DC | PRN
  Administered 2020-11-18: 50 mg via ORAL
  Filled 2020-11-17: qty 1

## 2020-11-17 MED ORDER — MAGNESIUM HYDROXIDE 400 MG/5ML PO SUSP: 30.0000 mL | Freq: Every day | ORAL | Status: DC | PRN

## 2020-11-17 MED ORDER — ACETAMINOPHEN 325 MG PO TABS: 650.0000 mg | ORAL_TABLET | Freq: Four times a day (QID) | ORAL | Status: DC | PRN

## 2020-11-17 MED ORDER — NICOTINE 14 MG/24HR TD PT24
14.0000 mg | MEDICATED_PATCH | Freq: Every day | TRANSDERMAL | Status: DC
  Administered 2020-11-18: 14 mg via TRANSDERMAL
  Filled 2020-11-17: qty 1

## 2020-11-17 NOTE — ED Provider Notes (Addendum)
Behavioral Health Admission [facility])  Date: 11/18/20 Patient Name: Tristan Valdez MRN: [id number] Chief Complaint:  Chief Complaint  Patient presents with   Suicidal    Pt threatens to over dose on fentanyl feeling depressed       Diagnoses:  Final diagnoses:  Substance induced mood disorder (HCC)  MDD (major depressive disorder), recurrent severe, without psychosis (HCC)  Amphetamine use disorder, severe, dependence (HCC)  Opioid abuse (HCC)  Suicidal ideation    HPI: Tristan Valdez is a 50 year old male with past psychiatric history significant for substance induced mood disorder, MDD, and polysubstance abuse, who presents to [facility] unaccompanied as a voluntary walk-in for SI with plan, as well as requesting help for substance abuse treatment.  Patient called the crisis line and spoke with Duard Brady, LMFT prior to his arrival at [facility].  Patient is well known to our service. Per chart review, patient presented to [facility] emergency department on 11/13/2020 for SI.  Patient was evaluated by TTS while patient was in the emergency department on 11/13/2020 and patient was then transferred to [facility] for continuous assessment.  Patient was then admitted to [facility] continuous assessment on 11/14/2020 and was discharged from [facility] on 11/15/2020.  Per chart review of patient's 7/22-7/23 [facility] encounter, social work was contacted to assist with substance abuse treatment resources and it was confirmed that patient could go to [facility] center in [place] for outpatient substance abuse treatment.  Per chart review of 11/15/20 [facility] discharge, it was documented that there were plans for the patient to get into an outpatient rehab center in [facility] called [facility] [phone] [place], [place] [address]), as well as possible plans for patient to go to inpatient substance abuse treatment.  It appears that [facility] was also mentioned as an option as well.  Per  11/15/20 [facility] discharge summary, patient could not be picked up to be brought to [place], but it was documented that a taxi could be paid for by someone named "Gwendolyn Lima" from the bus station in [place], [place] to [place] in [place] if patient was able to catch a bus to [facility].  Upon discussion of the resources with the patient, patient stated that he could not get to [place], [place], declined other substance abuse treatment options and demanded to leave [facility].  Patient was then discharged from [facility] on 11/15/2020.  Patient then presented to [facility] emergency department on 11/15/2020 for suicidal ideation.  Upon TTS evaluation of patient on 11/16/2020, patient was transferred to [facility] for continuous assessment on 11/16/2020, admitted to continuous assessment on 11/16/2008, and discharged from [facility] on 11/16/2020.  Per chart review of patient's [facility] 11/16/20 discharge summary, it was noted that patient declined all provided resources for treatment due to these resources not being with the patient prefers.  Suspicion of secondary gain was documented in patient's 11/16/20 [facility] discharge summary as well.  Upon this evening's evaluation, patient endorses active SI with intent and plan to overdose on fentanyl.  Per chart review, patient has reported SI with this similar plan in the recent past.  Patient reports multiple past suicide attempts, which include overdosing on 28 Ambien tablets 22 years ago, overdosing on fentanyl more than 5 times in the past 6 years, cutting himself at the age of 56, and attempting to hang himself at the age of 33.  Patient denies any additional history of self-injurious behavior via intentionally cutting or burning himself.  Patient denies  HI.  Patient denies AVH currently on exam. When asked about recent AVH, patient states "I can't say that I know or not" and states he is unsure of if he has been experiencing AVH recently.  Patient denies paranoia or delusions.  Per  chart review, patient was admitted to [facility] from 10/07/2020 to 10/14/2020 and was discharged with prescriptions for Remeron 15 mg p.o. at bedtime for MDD, Risperdal 1 mg p.o. at bedtime for auditory hallucinations, Risperdal 0.5 mg p.o. daily for auditory hallucinations, and trazodone 50 mg p.o. at bedtime as needed for sleep.  Patient denies any additional inpatient psychiatric hospitalizations since this June 2022 [facility] admission noted above. Patient states that he has not been taking any psychotropic medications outside of the hospital system since being discharged from [facility] on 10/14/2020 and he states that the only time he is taking psychotropic medications over the past few months was when he was admitted to [facility], the ED, and [facility].  Per chart review, prescriptions for Vistaril 25 mg p.o. 3 times daily as needed, nicotine patch, and Remeron 15 mg p.o. at bedtime were sent to patient's pharmacy on 11/15/2020 upon 11/15/20 [facility] discharge.  Patient states that he did not pick up these prescriptions.  Patient reports that he has been using meth via IV and smoking route, as well as snorting fentanyl.  Of note, patient's UDS is negative for all substances at this time, but patient's UDS was positive for amphetamines on 11/15/2020.Marland Kitchen  He reports that his last meth and fentanyl use were 4-5 days ago, in which patient states that 4 to 5 days ago, he used about $80 worth/3.4 g of meth and used 0.25 g of fentanyl.  Patient states that prior to his meth and fentanyl use 4 to 5 days ago, he has been completely sober for the prior 30 days.  Prior to this reported 30-day sobriety period, patient reported using about $60 worth of meth daily and an unknown amount of fentanyl once every few weeks.  Patient denies withdrawal symptoms from opioids/fentanyl, but patient does endorse having tremors as a withdrawal symptom from meth.  Patient reports that he is having tremors currently.  However, on my exam, patient is not noted to have any  tremors at this time. Patient denies history of any additional substance withdrawal symptoms and patient denies history of seizures.  Patient denies any additional illicit substance use.  Patient denies alcohol use.  Patient endorses smoking 1 pack of cigarettes daily.  Patient acknowledges that he was rude and combative with [facility] staff regarding not wanting to follow up with recommended substance abuse treatment resources recommendations during his past [facility] visits.  Patient states that he has been to [place] and a facility on [employer]" for substance abuse treatment in the past and he states that he did not like these facilities.  Patient states that he seriously wants help with substance abuse treatment and he states that at this point, he is willing to go to any substance abuse treatment facility. Patient reports poor sleep, but is unable to report how many hours he is sleeping each night.  Patient endorses anhedonia, as well as feelings of guilt, hopelessness, and worthlessness.  Patient endorses fatigue as well as poor concentration and focus.  Patient reports decreased appetite and fluctuating appetite that alternates between increased and decreased appetite.  Patient endorses weight loss over the past few months and he states that he thinks that he has probably lost about 10 pounds over the  past few months but he states that he is not entirely sure about how much weight he has lost exactly.  Patient is homeless in the [place] area.  Patient denies access to guns or weapons.  Patient is unable to contract for safety at this time.  Patient states that if he is to be discharged, he will attempt to kill himself.   PHQ 2-9:   Flowsheet Row ED from 11/17/2020 in [facility] ED from 11/16/2020 in [facility] ED from 11/15/2020 in [facility]  C-SSRS RISK CATEGORY Error: Q6 is Yes, you must answer 7 High Risk High  Risk        Total Time spent with patient: 30 minutes  Musculoskeletal  Strength & Muscle Tone: within normal limits Gait & Station: normal Patient leans: N/A  Psychiatric Specialty Exam  Presentation General Appearance: Appropriate for Environment; Fairly Groomed  Eye Contact:Fair  Speech:Clear and Coherent; Normal Rate  Speech Volume:Normal  Handedness:Right   Mood and Affect  Mood:Irritable; Depressed  Affect:Congruent   Thought Process  Thought Processes:Coherent; Goal Directed; Linear  Descriptions of Associations:Intact  Orientation:Full (Time, Place and Person)  Thought Content:WDL; Logical  Diagnosis of Schizophrenia or Schizoaffective disorder in past: No  Duration of Psychotic Symptoms: Less than six months  Hallucinations:Hallucinations: -- (Patiet denies AVH currently on exam. When asked about recent AVH, patient states "I can't say that I know or not" and states he is unsure of if he has been experiencing AVH.)  Ideas of Reference:None  Suicidal Thoughts:Suicidal Thoughts: Yes, Active SI Active Intent and/or Plan: With Intent; With Plan; With Means to Carry Out; With Access to Means  Homicidal Thoughts:Homicidal Thoughts: No   Sensorium  Memory:Immediate Fair; Recent Fair; Remote Fair  Judgment:Poor  Insight:Lacking   Executive Functions  Concentration:Fair  Attention Span:Fair  Recall:Fair  Fund of Knowledge:Fair  Language:Good   Psychomotor Activity  Psychomotor Activity:Psychomotor Activity: Normal   Assets  Assets:Communication Skills; Desire for Improvement; Financial Resources/Insurance; Housing; Leisure Time; Physical Health; Resilience   Sleep  Sleep:Sleep: Poor   Nutritional Assessment (For OBS and [facility]BC admissions only) Has the patient had a weight loss or gain of 10 pounds or more in the last 3 months?: Yes Has the patient had a decrease in food intake/or appetite?: Yes Does the patient have dental problems?:  No Does the patient have eating habits or behaviors that may be indicators of an eating disorder including binging or inducing vomiting?: No Has the patient recently lost weight without trying?: Yes, 2-13 lbs. Has the patient been eating poorly because of a decreased appetite?: Yes Malnutrition Screening Tool Score: 2   Physical Exam Vitals reviewed.  Constitutional:      General: He is not in acute distress.    Appearance: He is not ill-appearing, toxic-appearing or diaphoretic.  HENT:     Head: Normocephalic and atraumatic.     Right Ear: External ear normal.     Left Ear: External ear normal.     Nose: Nose normal.  Eyes:     General:        Right eye: No discharge.        Left eye: No discharge.     Conjunctiva/sclera: Conjunctivae normal.  Cardiovascular:     Rate and Rhythm: Normal rate.  Pulmonary:     Effort: Pulmonary effort is normal. No respiratory distress.  Musculoskeletal:        General: Normal range of motion.  Cervical back: Normal range of motion.  Neurological:     General: No focal deficit present.     Mental Status: He is alert and oriented to person, place, and time.     Comments: No tremor noted.   Psychiatric:        Attention and Perception: Attention and perception normal.        Speech: Speech normal.        Behavior: Behavior normal. Behavior is not agitated, slowed, aggressive, withdrawn, hyperactive or combative. Behavior is cooperative.        Thought Content: Thought content is not paranoid or delusional. Thought content includes suicidal ideation. Thought content does not include homicidal ideation. Thought content includes suicidal plan.     Comments: Mood is irritable and depressed with congruent affect. Patient denies AVH currently on exam. When asked about recent AVH, patient states "I can't say that I know or not" and states he is unsure of if he has been experiencing AVH.    Review of Systems  Constitutional:  Positive for  malaise/fatigue and weight loss. Negative for chills, diaphoresis and fever.  HENT:  Negative for congestion.   Respiratory:  Negative for cough and shortness of breath.   Cardiovascular:  Negative for chest pain and palpitations.  Gastrointestinal:  Negative for abdominal pain, constipation, diarrhea, nausea and vomiting.  Musculoskeletal:  Negative for joint pain and myalgias.  Neurological:  Negative for dizziness, seizures and headaches.       Patient denies withdrawal symptoms from opioids/fentanyl, but patient does endorse having tremors as a withdrawal symptom from meth.  Patient reports that he is having tremors currently.  However, on my exam, patient is not noted to have any tremors at this time. Patient denies history of any additional substance withdrawal symptoms and patient denies history of seizures.  Psychiatric/Behavioral:  Positive for depression, substance abuse and suicidal ideas. Negative for memory loss. The patient has insomnia. The patient is not nervous/anxious.        Patient denies AVH currently on exam. When asked about recent AVH, patient states "I can't say that I know or not" and states he is unsure of if he has been experiencing AVH.  All other systems reviewed and are negative.  Vitals: Blood pressure (!) 141/73, pulse 63, temperature 98 F (36.7 C), temperature source Oral, resp. rate 20, SpO2 98 %. There is no height or weight on file to calculate BMI.  Past Psychiatric History: Substance induced mood disorder, MDD, polysubstance abuse, methamphetamine use, [facility] inpatient admission in June 2022  Is the patient at risk to self? Yes  Has the patient been a risk to self in the past 6 months? Yes .    Has the patient been a risk to self within the distant past? Yes   Is the patient a risk to others? No   Has the patient been a risk to others in the past 6 months? No   Has the patient been a risk to others within the distant past? No   Past Medical History:   Past Medical History:  Diagnosis Date   Arthritis    Bilateral club feet    Hypertension     Past Surgical History:  Procedure Laterality Date   APPENDECTOMY     reconstruction of tendons of left arm Left     Family History:  Family History  Problem Relation Age of Onset   CAD Mother    CAD Father     Social History:  Social History   Socioeconomic History   Marital status: Divorced    Spouse name: Not on file   Number of children: Not on file   Years of education: Not on file   Highest education level: Not on file  Occupational History   Not on file  Tobacco Use   Smoking status: Every Day    Packs/day: 1.00    Types: Cigarettes   Smokeless tobacco: Never   Tobacco comments:    Last used: 2 days ago  Vaping Use   Vaping Use: Some days  Substance and Sexual Activity   Alcohol use: Never   Drug use: Not Currently    Types: Methamphetamines    Comment: Fentanyl   Sexual activity: Not Currently  Other Topics Concern   Not on file  Social History Narrative   Not on file   Social Determinants of Health   Financial Resource Strain: Not on file  Food Insecurity: Not on file  Transportation Needs: Not on file  Physical Activity: Not on file  Stress: Not on file  Social Connections: Not on file  Intimate Partner Violence: Not on file    SDOH:  SDOH Screenings   Alcohol Screen: Low Risk    Last Alcohol Screening Score (AUDIT): 0  Depression (PHQ2-9): Not on file  Financial Resource Strain: Not on file  Food Insecurity: Not on file  Housing: Not on file  Physical Activity: Not on file  Social Connections: Not on file  Stress: Not on file  Tobacco Use: High Risk   Smoking Tobacco Use: Every Day   Smokeless Tobacco Use: Never  Transportation Needs: Not on file    Last Labs:  Admission on 11/17/2020  Component Date Value Ref Range Status   SARS Coronavirus 2 Ag 11/18/2020 Negative  Negative Preliminary   POC Amphetamine UR 11/18/2020 None  Detected  NONE DETECTED (Cut Off Level 1000 ng/mL) Preliminary   POC Secobarbital (BAR) 11/18/2020 None Detected  NONE DETECTED (Cut Off Level 300 ng/mL) Preliminary   POC Buprenorphine (BUP) 11/18/2020 None Detected  NONE DETECTED (Cut Off Level 10 ng/mL) Preliminary   POC Oxazepam (BZO) 11/18/2020 None Detected  NONE DETECTED (Cut Off Level 300 ng/mL) Preliminary   POC Cocaine UR 11/18/2020 None Detected  NONE DETECTED (Cut Off Level 300 ng/mL) Preliminary   POC Methamphetamine UR 11/18/2020 None Detected  NONE DETECTED (Cut Off Level 1000 ng/mL) Preliminary   POC Morphine 11/18/2020 None Detected  NONE DETECTED (Cut Off Level 300 ng/mL) Preliminary   POC Oxycodone UR 11/18/2020 None Detected  NONE DETECTED (Cut Off Level 100 ng/mL) Preliminary   POC Methadone UR 11/18/2020 None Detected  NONE DETECTED (Cut Off Level 300 ng/mL) Preliminary   POC Marijuana UR 11/18/2020 None Detected  NONE DETECTED (Cut Off Level 50 ng/mL) Preliminary  Admission on 11/15/2020, Discharged on 11/16/2020  Component Date Value Ref Range Status   Sodium 11/15/2020 140  135 - 145 mmol/L Final   Potassium 11/15/2020 3.4 (A) 3.5 - 5.1 mmol/L Final   Chloride 11/15/2020 105  98 - 111 mmol/L Final   CO2 11/15/2020 27  22 - 32 mmol/L Final   Glucose, Bld 11/15/2020 90  70 - 99 mg/dL Final   Glucose reference range applies only to samples taken after fasting for at least 8 hours.   BUN 11/15/2020 19  6 - 20 mg/dL Final   Creatinine, Ser 11/15/2020 0.82  0.61 - 1.24 mg/dL Final   Calcium 16/10/960407/23/2022 8.8 (A) 8.9 - 10.3 mg/dL  Final   Total Protein 11/15/2020 6.4 (A) 6.5 - 8.1 g/dL Final   Albumin 16/01/9603 3.6  3.5 - 5.0 g/dL Final   AST 54/12/8117 25  15 - 41 U/L Final   ALT 11/15/2020 35  0 - 44 U/L Final   Alkaline Phosphatase 11/15/2020 51  38 - 126 U/L Final   Total Bilirubin 11/15/2020 0.9  0.3 - 1.2 mg/dL Final   GFR, Estimated 11/15/2020 >60  >60 mL/min Final   Comment: (NOTE) Calculated using the CKD-EPI  Creatinine Equation (2021)    Anion gap 11/15/2020 8  5 - 15 Final   Performed at [facility], [address], [place], [place] [address]   Alcohol, Ethyl (B) 11/15/2020 <10  <10 mg/dL Final   Comment: (NOTE) Lowest detectable limit for serum alcohol is 10 mg/dL.  For medical purposes only. Performed at [facility], [address], [place], [place] [address]    Opiates 11/15/2020 NONE DETECTED  NONE DETECTED Final   Cocaine 11/15/2020 NONE DETECTED  NONE DETECTED Final   Benzodiazepines 11/15/2020 NONE DETECTED  NONE DETECTED Final   Amphetamines 11/15/2020 POSITIVE (A) NONE DETECTED Final   Tetrahydrocannabinol 11/15/2020 NONE DETECTED  NONE DETECTED Final   Barbiturates 11/15/2020 NONE DETECTED  NONE DETECTED Final   Comment: (NOTE) DRUG SCREEN FOR MEDICAL PURPOSES ONLY.  IF CONFIRMATION IS NEEDED FOR ANY PURPOSE, NOTIFY LAB WITHIN 5 DAYS.  LOWEST DETECTABLE LIMITS FOR URINE DRUG SCREEN Drug Class                     Cutoff (ng/mL) Amphetamine and metabolites    1000 Barbiturate and metabolites    200 Benzodiazepine                 200 Tricyclics and metabolites     300 Opiates and metabolites        300 Cocaine and metabolites        300 THC                            50 Performed at [facility], [address] [place], [place] [address]    WBC 11/15/2020 6.6  4.0 - 10.5 K/uL Final   RBC 11/15/2020 4.76  4.22 - 5.81 MIL/uL Final   Hemoglobin 11/15/2020 14.6  13.0 - 17.0 g/dL Final   HCT 78/46/9629 44.7  39.0 - 52.0 % Final   MCV 11/15/2020 93.9  80.0 - 100.0 fL Final   MCH 11/15/2020 30.7  26.0 - 34.0 pg Final   MCHC 11/15/2020 32.7  30.0 - 36.0 g/dL Final   RDW 52/84/1324 13.2  11.5 - 15.5 % Final   Platelets 11/15/2020 153  150 - 400 K/uL Final   nRBC 11/15/2020 0.0  0.0 - 0.2 % Final   Neutrophils Relative % 11/15/2020 60  % Final   Neutro Abs 11/15/2020 3.9  1.7 - 7.7 K/uL Final   Lymphocytes Relative  11/15/2020 26  % Final   Lymphs Abs 11/15/2020 1.7  0.7 - 4.0 K/uL Final   Monocytes Relative 11/15/2020 12  % Final   Monocytes Absolute 11/15/2020 0.8  0.1 - 1.0 K/uL Final   Eosinophils Relative 11/15/2020 2  % Final   Eosinophils Absolute 11/15/2020 0.1  0.0 - 0.5 K/uL Final   Basophils Relative 11/15/2020 0  % Final   Basophils Absolute 11/15/2020 0.0  0.0 - 0.1 K/uL Final  Immature Granulocytes 11/15/2020 0  % Final   Abs Immature Granulocytes 11/15/2020 0.02  0.00 - 0.07 K/uL Final   Performed at [facility], [address], [place], [place] [address]   SARS Coronavirus 2 by RT PCR 11/16/2020 NEGATIVE  NEGATIVE Final   Comment: (NOTE) SARS-CoV-2 target nucleic acids are NOT DETECTED.  The SARS-CoV-2 RNA is generally detectable in upper respiratory specimens during the acute phase of infection. The lowest concentration of SARS-CoV-2 viral copies this assay can detect is 138 copies/mL. A negative result does not preclude SARS-Cov-2 infection and should not be used as the sole basis for treatment or other patient management decisions. A negative result may occur with  improper specimen collection/handling, submission of specimen other than nasopharyngeal swab, presence of viral mutation(s) within the areas targeted by this assay, and inadequate number of viral copies(<138 copies/mL). A negative result must be combined with clinical observations, patient history, and epidemiological information. The expected result is Negative.  Fact Sheet for Patients:  BloggerCourse.com  Fact Sheet for Healthcare Providers:  SeriousBroker.it  This test is no                          t yet approved or cleared by the [place] FDA and  has been authorized for detection and/or diagnosis of SARS-CoV-2 by FDA under an Emergency Use Authorization (EUA). This EUA will remain  in effect (meaning this test can be used) for the  duration of the COVID-19 declaration under Section 564(b)(1) of the Act, 21 U.S.C.section 360bbb-3(b)(1), unless the authorization is terminated  or revoked sooner.       Influenza A by PCR 11/16/2020 NEGATIVE  NEGATIVE Final   Influenza B by PCR 11/16/2020 NEGATIVE  NEGATIVE Final   Comment: (NOTE) The Xpert Xpress SARS-CoV-2/FLU/RSV plus assay is intended as an aid in the diagnosis of influenza from Nasopharyngeal swab specimens and should not be used as a sole basis for treatment. Nasal washings and aspirates are unacceptable for Xpert Xpress SARS-CoV-2/FLU/RSV testing.  Fact Sheet for Patients: BloggerCourse.com  Fact Sheet for Healthcare Providers: SeriousBroker.it  This test is not yet approved or cleared by the [place] FDA and has been authorized for detection and/or diagnosis of SARS-CoV-2 by FDA under an Emergency Use Authorization (EUA). This EUA will remain in effect (meaning this test can be used) for the duration of the COVID-19 declaration under Section 564(b)(1) of the Act, 21 U.S.C. section 360bbb-3(b)(1), unless the authorization is terminated or revoked.  Performed at [facility], [address] [place], [place] [address]   Admission on 11/13/2020, Discharged on 11/14/2020  Component Date Value Ref Range Status   SARS Coronavirus 2 by RT PCR 11/13/2020 NEGATIVE  NEGATIVE Final   Comment: (NOTE) SARS-CoV-2 target nucleic acids are NOT DETECTED.  The SARS-CoV-2 RNA is generally detectable in upper respiratory specimens during the acute phase of infection. The lowest concentration of SARS-CoV-2 viral copies this assay can detect is 138 copies/mL. A negative result does not preclude SARS-Cov-2 infection and should not be used as the sole basis for treatment or other patient management decisions. A negative result may occur with  improper specimen collection/handling, submission of  specimen other than nasopharyngeal swab, presence of viral mutation(s) within the areas targeted by this assay, and inadequate number of viral copies(<138 copies/mL). A negative result must be combined with clinical observations, patient history, and epidemiological information. The expected result is Negative.  Fact Sheet  for Patients:  BloggerCourse.com  Fact Sheet for Healthcare Providers:  SeriousBroker.it  This test is no                          t yet approved or cleared by the [place] FDA and  has been authorized for detection and/or diagnosis of SARS-CoV-2 by FDA under an Emergency Use Authorization (EUA). This EUA will remain  in effect (meaning this test can be used) for the duration of the COVID-19 declaration under Section 564(b)(1) of the Act, 21 U.S.C.section 360bbb-3(b)(1), unless the authorization is terminated  or revoked sooner.       Influenza A by PCR 11/13/2020 NEGATIVE  NEGATIVE Final   Influenza B by PCR 11/13/2020 NEGATIVE  NEGATIVE Final   Comment: (NOTE) The Xpert Xpress SARS-CoV-2/FLU/RSV plus assay is intended as an aid in the diagnosis of influenza from Nasopharyngeal swab specimens and should not be used as a sole basis for treatment. Nasal washings and aspirates are unacceptable for Xpert Xpress SARS-CoV-2/FLU/RSV testing.  Fact Sheet for Patients: BloggerCourse.com  Fact Sheet for Healthcare Providers: SeriousBroker.it  This test is not yet approved or cleared by the [place] FDA and has been authorized for detection and/or diagnosis of SARS-CoV-2 by FDA under an Emergency Use Authorization (EUA). This EUA will remain in effect (meaning this test can be used) for the duration of the COVID-19 declaration under Section 564(b)(1) of the Act, 21 U.S.C. section 360bbb-3(b)(1), unless the authorization is terminated  or revoked.  Performed at [facility], [address], [place], [place] [address]    Sodium 11/13/2020 136  135 - 145 mmol/L Final   Potassium 11/13/2020 4.2  3.5 - 5.1 mmol/L Final   Chloride 11/13/2020 103  98 - 111 mmol/L Final   CO2 11/13/2020 27  22 - 32 mmol/L Final   Glucose, Bld 11/13/2020 92  70 - 99 mg/dL Final   Glucose reference range applies only to samples taken after fasting for at least 8 hours.   BUN 11/13/2020 14  6 - 20 mg/dL Final   Creatinine, Ser 11/13/2020 0.85  0.61 - 1.24 mg/dL Final   Calcium 12/87/8676 8.8 (A) 8.9 - 10.3 mg/dL Final   Total Protein 72/12/4707 6.3 (A) 6.5 - 8.1 g/dL Final   Albumin 62/83/6629 3.6  3.5 - 5.0 g/dL Final   AST 47/65/4650 23  15 - 41 U/L Final   ALT 11/13/2020 40  0 - 44 U/L Final   Alkaline Phosphatase 11/13/2020 49  38 - 126 U/L Final   Total Bilirubin 11/13/2020 1.0  0.3 - 1.2 mg/dL Final   GFR, Estimated 11/13/2020 >60  >60 mL/min Final   Comment: (NOTE) Calculated using the CKD-EPI Creatinine Equation (2021)    Anion gap 11/13/2020 6  5 - 15 Final   Performed at [facility], [address] [place], [place] [address]   Alcohol, Ethyl (B) 11/13/2020 <10  <10 mg/dL Final   Comment: (NOTE) Lowest detectable limit for serum alcohol is 10 mg/dL.  For medical purposes only. Performed at [facility], [address], [place], [place] [address]    Opiates 11/13/2020 NONE DETECTED  NONE DETECTED Final   Cocaine 11/13/2020 NONE DETECTED  NONE DETECTED Final   Benzodiazepines 11/13/2020 NONE DETECTED  NONE DETECTED Final   Amphetamines 11/13/2020 POSITIVE (A) NONE DETECTED Final   Tetrahydrocannabinol 11/13/2020 NONE DETECTED  NONE DETECTED Final   Barbiturates 11/13/2020 NONE DETECTED  NONE DETECTED Final   Comment: (NOTE) DRUG SCREEN FOR MEDICAL PURPOSES ONLY.  IF CONFIRMATION IS NEEDED FOR ANY PURPOSE, NOTIFY LAB WITHIN 5 DAYS.  LOWEST DETECTABLE LIMITS FOR URINE DRUG SCREEN Drug Class                      Cutoff (ng/mL) Amphetamine and metabolites    1000 Barbiturate and metabolites    200 Benzodiazepine                 200 Tricyclics and metabolites     300 Opiates and metabolites        300 Cocaine and metabolites        300 THC                            50 Performed at [facility], [address], [place], [place] [address]    WBC 11/13/2020 6.0  4.0 - 10.5 K/uL Final   RBC 11/13/2020 4.79  4.22 - 5.81 MIL/uL Final   Hemoglobin 11/13/2020 14.6  13.0 - 17.0 g/dL Final   HCT 60/45/4098 45.0  39.0 - 52.0 % Final   MCV 11/13/2020 93.9  80.0 - 100.0 fL Final   MCH 11/13/2020 30.5  26.0 - 34.0 pg Final   MCHC 11/13/2020 32.4  30.0 - 36.0 g/dL Final   RDW 11/91/4782 13.5  11.5 - 15.5 % Final   Platelets 11/13/2020 181  150 - 400 K/uL Final   nRBC 11/13/2020 0.0  0.0 - 0.2 % Final   Neutrophils Relative % 11/13/2020 53  % Final   Neutro Abs 11/13/2020 3.2  1.7 - 7.7 K/uL Final   Lymphocytes Relative 11/13/2020 30  % Final   Lymphs Abs 11/13/2020 1.8  0.7 - 4.0 K/uL Final   Monocytes Relative 11/13/2020 14  % Final   Monocytes Absolute 11/13/2020 0.8  0.1 - 1.0 K/uL Final   Eosinophils Relative 11/13/2020 3  % Final   Eosinophils Absolute 11/13/2020 0.2  0.0 - 0.5 K/uL Final   Basophils Relative 11/13/2020 0  % Final   Basophils Absolute 11/13/2020 0.0  0.0 - 0.1 K/uL Final   Immature Granulocytes 11/13/2020 0  % Final   Abs Immature Granulocytes 11/13/2020 0.01  0.00 - 0.07 K/uL Final   Performed at [facility], [address], [place], [place] [address]   Acetaminophen (Tylenol), Serum 11/13/2020 <10 (A) 10 - 30 ug/mL Final   Comment: (NOTE) Therapeutic concentrations vary significantly. A range of 10-30 ug/mL  may be an effective concentration for many patients. However, some  are best treated at concentrations outside of this range. Acetaminophen concentrations >150 ug/mL at 4 hours after ingestion  and >50 ug/mL at 12 hours after ingestion are often  associated with  toxic reactions.  Performed at [facility], [address], [place], [place] [address]    Salicylate Lvl 11/13/2020 <7.0 (A) 7.0 - 30.0 mg/dL Final   Performed at [facility], [address], [place], [place] [address]  Admission on 10/21/2020, Discharged on 10/22/2020  Component Date Value Ref Range Status   Sodium 10/21/2020 137  135 - 145 mmol/L Final   Potassium 10/21/2020 3.4 (A) 3.5 - 5.1 mmol/L Final   Chloride 10/21/2020 101  98 - 111 mmol/L Final   CO2 10/21/2020 30  22 - 32 mmol/L Final   Glucose, Bld 10/21/2020 112 (A) 70 - 99  mg/dL Final   Glucose reference range applies only to samples taken after fasting for at least 8 hours.   BUN 10/21/2020 14  6 - 20 mg/dL Final   Creatinine, Ser 10/21/2020 0.99  0.61 - 1.24 mg/dL Final   Calcium 16/01/9603 8.9  8.9 - 10.3 mg/dL Final   Total Protein 54/12/8117 5.9 (A) 6.5 - 8.1 g/dL Final   Albumin 14/78/2956 3.4 (A) 3.5 - 5.0 g/dL Final   AST 21/30/8657 26  15 - 41 U/L Final   ALT 10/21/2020 34  0 - 44 U/L Final   Alkaline Phosphatase 10/21/2020 57  38 - 126 U/L Final   Total Bilirubin 10/21/2020 1.2  0.3 - 1.2 mg/dL Final   GFR, Estimated 10/21/2020 >60  >60 mL/min Final   Comment: (NOTE) Calculated using the CKD-EPI Creatinine Equation (2021)    Anion gap 10/21/2020 6  5 - 15 Final   Performed at [facility], [address], [place], [place] [address]   Alcohol, Ethyl (B) 10/21/2020 <10  <10 mg/dL Final   Comment: (NOTE) Lowest detectable limit for serum alcohol is 10 mg/dL.  For medical purposes only. Performed at [facility], [address] [place], [place] [address]    Salicylate Lvl 10/21/2020 <7.0 (A) 7.0 - 30.0 mg/dL Final   Performed at [facility], [address], [place], [place] [address]   Acetaminophen (Tylenol), Serum 10/21/2020 <10 (A) 10 - 30 ug/mL Final   Comment: (NOTE) Therapeutic concentrations vary significantly. A range of 10-30 ug/mL  may be an  effective concentration for many patients. However, some  are best treated at concentrations outside of this range. Acetaminophen concentrations >150 ug/mL at 4 hours after ingestion  and >50 ug/mL at 12 hours after ingestion are often associated with  toxic reactions.  Performed at [facility], [address], [place], [place] [address]    WBC 10/21/2020 8.2  4.0 - 10.5 K/uL Final   RBC 10/21/2020 4.54  4.22 - 5.81 MIL/uL Final   Hemoglobin 10/21/2020 13.9  13.0 - 17.0 g/dL Final   HCT 72/53/6644 42.5  39.0 - 52.0 % Final   MCV 10/21/2020 93.6  80.0 - 100.0 fL Final   MCH 10/21/2020 30.6  26.0 - 34.0 pg Final   MCHC 10/21/2020 32.7  30.0 - 36.0 g/dL Final   RDW 03/47/4259 13.1  11.5 - 15.5 % Final   Platelets 10/21/2020 183  150 - 400 K/uL Final   nRBC 10/21/2020 0.0  0.0 - 0.2 % Final   Performed at [facility], [address], [place], [place] [address]   Opiates 10/21/2020 NONE DETECTED  NONE DETECTED Final   Cocaine 10/21/2020 NONE DETECTED  NONE DETECTED Final   Benzodiazepines 10/21/2020 NONE DETECTED  NONE DETECTED Final   Amphetamines 10/21/2020 POSITIVE (A) NONE DETECTED Final   Tetrahydrocannabinol 10/21/2020 NONE DETECTED  NONE DETECTED Final   Barbiturates 10/21/2020 NONE DETECTED  NONE DETECTED Final   Comment: (NOTE) DRUG SCREEN FOR MEDICAL PURPOSES ONLY.  IF CONFIRMATION IS NEEDED FOR ANY PURPOSE, NOTIFY LAB WITHIN 5 DAYS.  LOWEST DETECTABLE LIMITS FOR URINE DRUG SCREEN Drug Class                     Cutoff (ng/mL) Amphetamine and metabolites    1000 Barbiturate and metabolites    200 Benzodiazepine                 200 Tricyclics  and metabolites     300 Opiates and metabolites        300 Cocaine and metabolites        300 THC                            50 Performed at [facility], [address] [place], [place] [address]    SARS Coronavirus 2 by RT PCR 10/21/2020 NEGATIVE  NEGATIVE Final   Comment: (NOTE) SARS-CoV-2 target nucleic  acids are NOT DETECTED.  The SARS-CoV-2 RNA is generally detectable in upper respiratory specimens during the acute phase of infection. The lowest concentration of SARS-CoV-2 viral copies this assay can detect is 138 copies/mL. A negative result does not preclude SARS-Cov-2 infection and should not be used as the sole basis for treatment or other patient management decisions. A negative result may occur with  improper specimen collection/handling, submission of specimen other than nasopharyngeal swab, presence of viral mutation(s) within the areas targeted by this assay, and inadequate number of viral copies(<138 copies/mL). A negative result must be combined with clinical observations, patient history, and epidemiological information. The expected result is Negative.  Fact Sheet for Patients:  BloggerCourse.com  Fact Sheet for Healthcare Providers:  SeriousBroker.it  This test is no                          t yet approved or cleared by the [place] FDA and  has been authorized for detection and/or diagnosis of SARS-CoV-2 by FDA under an Emergency Use Authorization (EUA). This EUA will remain  in effect (meaning this test can be used) for the duration of the COVID-19 declaration under Section 564(b)(1) of the Act, 21 U.S.C.section 360bbb-3(b)(1), unless the authorization is terminated  or revoked sooner.       Influenza A by PCR 10/21/2020 NEGATIVE  NEGATIVE Final   Influenza B by PCR 10/21/2020 NEGATIVE  NEGATIVE Final   Comment: (NOTE) The Xpert Xpress SARS-CoV-2/FLU/RSV plus assay is intended as an aid in the diagnosis of influenza from Nasopharyngeal swab specimens and should not be used as a sole basis for treatment. Nasal washings and aspirates are unacceptable for Xpert Xpress SARS-CoV-2/FLU/RSV testing.  Fact Sheet for Patients: BloggerCourse.com  Fact Sheet for Healthcare  Providers: SeriousBroker.it  This test is not yet approved or cleared by the [place] FDA and has been authorized for detection and/or diagnosis of SARS-CoV-2 by FDA under an Emergency Use Authorization (EUA). This EUA will remain in effect (meaning this test can be used) for the duration of the COVID-19 declaration under Section 564(b)(1) of the Act, 21 U.S.C. section 360bbb-3(b)(1), unless the authorization is terminated or revoked.  Performed at [facility], [address], [place], [place] [address]   Admission on 10/07/2020, Discharged on 10/14/2020  Component Date Value Ref Range Status   Cholesterol 10/11/2020 150  0 - 200 mg/dL Final   Triglycerides 09/81/1914 173 (A) <150 mg/dL Final   HDL 78/29/5621 35 (A) >40 mg/dL Final   Total CHOL/HDL Ratio 10/11/2020 4.3  RATIO Final   VLDL 10/11/2020 35  0 - 40 mg/dL Final   LDL Cholesterol 10/11/2020 80  0 - 99 mg/dL Final   Comment:        Total Cholesterol/HDL:CHD Risk Coronary Heart Disease Risk Table  Men   Women  1/2 Average Risk   3.4   3.3  Average Risk       5.0   4.4  2 X Average Risk   9.6   7.1  3 X Average Risk  23.4   11.0        Use the calculated Patient Ratio above and the CHD Risk Table to determine the patient's CHD Risk.        ATP III CLASSIFICATION (LDL):  <100     mg/dL   Optimal  161-096  mg/dL   Near or Above                    Optimal  130-159  mg/dL   Borderline  045-409  mg/dL   High  >811     mg/dL   Very High Performed at [facility], [address] [place], [place] [address]    Hgb A1c MFr Bld 10/11/2020 5.8 (A) 4.8 - 5.6 % Final   Comment: (NOTE)         Prediabetes: 5.7 - 6.4         Diabetes: >6.4         Glycemic control for adults with diabetes: <7.0    Mean Plasma Glucose 10/11/2020 120  mg/dL Final   Comment: (NOTE) Performed At: [facility] [address] [place], [place] [id number] Jolene Schimke MD MV:[id number]    TSH 10/11/2020 1.096  0.350 - 4.500 uIU/mL Final   Comment: Performed by a 3rd Generation assay with a functional sensitivity of <=0.01 uIU/mL. Performed at [facility], [address], [place], [place] [address]   Admission on 10/06/2020, Discharged on 10/07/2020  Component Date Value Ref Range Status   WBC 10/06/2020 6.7  4.0 - 10.5 K/uL Final   RBC 10/06/2020 4.47  4.22 - 5.81 MIL/uL Final   Hemoglobin 10/06/2020 13.6  13.0 - 17.0 g/dL Final   HCT 44/04/270 42.3  39.0 - 52.0 % Final   MCV 10/06/2020 94.6  80.0 - 100.0 fL Final   MCH 10/06/2020 30.4  26.0 - 34.0 pg Final   MCHC 10/06/2020 32.2  30.0 - 36.0 g/dL Final   RDW 53/66/4403 12.7  11.5 - 15.5 % Final   Platelets 10/06/2020 169  150 - 400 K/uL Final   nRBC 10/06/2020 0.0  0.0 - 0.2 % Final   Performed at [facility], [address] [place], [place] [address]   Sodium 10/06/2020 139  135 - 145 mmol/L Final   Potassium 10/06/2020 3.3 (A) 3.5 - 5.1 mmol/L Final   Chloride 10/06/2020 105  98 - 111 mmol/L Final   CO2 10/06/2020 28  22 - 32 mmol/L Final   Glucose, Bld 10/06/2020 114 (A) 70 - 99 mg/dL Final   Glucose reference range applies only to samples taken after fasting for at least 8 hours.   BUN 10/06/2020 18  6 - 20 mg/dL Final   Creatinine, Ser 10/06/2020 1.00  0.61 - 1.24 mg/dL Final   Calcium 95/63/8756 8.9  8.9 - 10.3 mg/dL Final   Total Protein 43/32/9518 6.0 (A) 6.5 - 8.1 g/dL Final   Albumin 84/16/6063 3.4 (A) 3.5 - 5.0 g/dL Final   AST 01/60/1093 19  15 - 41 U/L Final   ALT 10/06/2020 25  0 - 44 U/L Final   Alkaline Phosphatase 10/06/2020 51  38 - 126 U/L Final   Total Bilirubin 10/06/2020 0.2 (A) 0.3 -  1.2 mg/dL Final   GFR, Estimated 10/06/2020 >60  >60 mL/min Final   Comment: (NOTE) Calculated using the CKD-EPI Creatinine Equation (2021)    Anion gap 10/06/2020 6  5 - 15 Final   Performed at [facility], [address] [place], [place]  [address]   Alcohol, Ethyl (B) 10/06/2020 <10  <10 mg/dL Final   Comment: (NOTE) Lowest detectable limit for serum alcohol is 10 mg/dL.  For medical purposes only. Performed at [facility], [address] [place], [place] [address]    Opiates 10/06/2020 NONE DETECTED  NONE DETECTED Final   Cocaine 10/06/2020 NONE DETECTED  NONE DETECTED Final   Benzodiazepines 10/06/2020 NONE DETECTED  NONE DETECTED Final   Amphetamines 10/06/2020 POSITIVE (A) NONE DETECTED Final   Tetrahydrocannabinol 10/06/2020 NONE DETECTED  NONE DETECTED Final   Barbiturates 10/06/2020 NONE DETECTED  NONE DETECTED Final   Comment: (NOTE) DRUG SCREEN FOR MEDICAL PURPOSES ONLY.  IF CONFIRMATION IS NEEDED FOR ANY PURPOSE, NOTIFY LAB WITHIN 5 DAYS.  LOWEST DETECTABLE LIMITS FOR URINE DRUG SCREEN Drug Class                     Cutoff (ng/mL) Amphetamine and metabolites    1000 Barbiturate and metabolites    200 Benzodiazepine                 200 Tricyclics and metabolites     300 Opiates and metabolites        300 Cocaine and metabolites        300 THC                            50 Performed at [facility], [address], [place], [place] [address]    Salicylate Lvl 10/06/2020 <7.0 (A) 7.0 - 30.0 mg/dL Final   Performed at [facility], [address], [place], [place] [address]   Acetaminophen (Tylenol), Serum 10/06/2020 <10 (A) 10 - 30 ug/mL Final   Performed at [facility], [address], [place], [place] [address]   SARS Coronavirus 2 by RT PCR 10/07/2020 NEGATIVE  NEGATIVE Final   Comment: (NOTE) SARS-CoV-2 target nucleic acids are NOT DETECTED.  The SARS-CoV-2 RNA is generally detectable in upper respiratory specimens during the acute phase of infection. The lowest concentration of SARS-CoV-2 viral copies this assay can detect is 138 copies/mL. A negative result does not preclude SARS-Cov-2 infection and should not be used as the sole basis for treatment or other patient  management decisions. A negative result may occur with  improper specimen collection/handling, submission of specimen other than nasopharyngeal swab, presence of viral mutation(s) within the areas targeted by this assay, and inadequate number of viral copies(<138 copies/mL). A negative result must be combined with clinical observations, patient history, and epidemiological information. The expected result is Negative.  Fact Sheet for Patients:  BloggerCourse.com  Fact Sheet for Healthcare Providers:  SeriousBroker.it  This test is no                          t yet approved or cleared by the [place] FDA and  has been authorized for detection and/or diagnosis of SARS-CoV-2 by FDA under an Emergency Use Authorization (EUA). This EUA will remain  in effect (meaning this test can be used) for the duration of the COVID-19 declaration under Section 564(b)(1)  of the Act, 21 U.S.C.section 360bbb-3(b)(1), unless the authorization is terminated  or revoked sooner.       Influenza A by PCR 10/07/2020 NEGATIVE  NEGATIVE Final   Influenza B by PCR 10/07/2020 NEGATIVE  NEGATIVE Final   Comment: (NOTE) The Xpert Xpress SARS-CoV-2/FLU/RSV plus assay is intended as an aid in the diagnosis of influenza from Nasopharyngeal swab specimens and should not be used as a sole basis for treatment. Nasal washings and aspirates are unacceptable for Xpert Xpress SARS-CoV-2/FLU/RSV testing.  Fact Sheet for Patients: BloggerCourse.com  Fact Sheet for Healthcare Providers: SeriousBroker.it  This test is not yet approved or cleared by the [place] FDA and has been authorized for detection and/or diagnosis of SARS-CoV-2 by FDA under an Emergency Use Authorization (EUA). This EUA will remain in effect (meaning this test can be used) for the duration of the COVID-19 declaration under Section 564(b)(1)  of the Act, 21 U.S.C. section 360bbb-3(b)(1), unless the authorization is terminated or revoked.  Performed at [facility], [address] [place], [place] [address]   Admission on 08/19/2020, Discharged on 08/20/2020  Component Date Value Ref Range Status   Sodium 08/19/2020 140  135 - 145 mmol/L Final   Potassium 08/19/2020 4.3  3.5 - 5.1 mmol/L Final   Chloride 08/19/2020 105  98 - 111 mmol/L Final   CO2 08/19/2020 27  22 - 32 mmol/L Final   Glucose, Bld 08/19/2020 95  70 - 99 mg/dL Final   Glucose reference range applies only to samples taken after fasting for at least 8 hours.   BUN 08/19/2020 13  6 - 20 mg/dL Final   Creatinine, Ser 08/19/2020 0.94  0.61 - 1.24 mg/dL Final   Calcium 60/45/4098 9.6  8.9 - 10.3 mg/dL Final   Total Protein 11/91/4782 6.7  6.5 - 8.1 g/dL Final   Albumin 95/62/1308 3.9  3.5 - 5.0 g/dL Final   AST 65/78/4696 35  15 - 41 U/L Final   ALT 08/19/2020 61 (A) 0 - 44 U/L Final   Alkaline Phosphatase 08/19/2020 64  38 - 126 U/L Final   Total Bilirubin 08/19/2020 0.8  0.3 - 1.2 mg/dL Final   GFR, Estimated 08/19/2020 >60  >60 mL/min Final   Comment: (NOTE) Calculated using the CKD-EPI Creatinine Equation (2021)    Anion gap 08/19/2020 8  5 - 15 Final   Performed at [facility], [address], [place], [place] [address]   WBC 08/19/2020 8.0  4.0 - 10.5 K/uL Final   RBC 08/19/2020 5.28  4.22 - 5.81 MIL/uL Final   Hemoglobin 08/19/2020 15.9  13.0 - 17.0 g/dL Final   HCT 41/32/4401 49.4  39.0 - 52.0 % Final   MCV 08/19/2020 93.6  80.0 - 100.0 fL Final   MCH 08/19/2020 30.1  26.0 - 34.0 pg Final   MCHC 08/19/2020 32.2  30.0 - 36.0 g/dL Final   RDW 02/72/5366 12.7  11.5 - 15.5 % Final   Platelets 08/19/2020 197  150 - 400 K/uL Final   nRBC 08/19/2020 0.0  0.0 - 0.2 % Final   Neutrophils Relative % 08/19/2020 64  % Final   Neutro Abs 08/19/2020 5.1  1.7 - 7.7 K/uL Final   Lymphocytes Relative 08/19/2020 21  % Final   Lymphs Abs 08/19/2020  1.7  0.7 - 4.0 K/uL Final   Monocytes Relative 08/19/2020 13  % Final   Monocytes Absolute 08/19/2020 1.0  0.1 - 1.0 K/uL Final   Eosinophils  Relative 08/19/2020 2  % Final   Eosinophils Absolute 08/19/2020 0.1  0.0 - 0.5 K/uL Final   Basophils Relative 08/19/2020 0  % Final   Basophils Absolute 08/19/2020 0.0  0.0 - 0.1 K/uL Final   Immature Granulocytes 08/19/2020 0  % Final   Abs Immature Granulocytes 08/19/2020 0.03  0.00 - 0.07 K/uL Final   Performed at [facility], [address], [place], [place] [address]   Alcohol, Ethyl (B) 08/19/2020 <10  <10 mg/dL Final   Comment: (NOTE) Lowest detectable limit for serum alcohol is 10 mg/dL.  For medical purposes only. Performed at [facility], [address] [place], [place] [address]    Opiates 08/19/2020 NONE DETECTED  NONE DETECTED Final   Cocaine 08/19/2020 NONE DETECTED  NONE DETECTED Final   Benzodiazepines 08/19/2020 NONE DETECTED  NONE DETECTED Final   Amphetamines 08/19/2020 NONE DETECTED  NONE DETECTED Final   Tetrahydrocannabinol 08/19/2020 NONE DETECTED  NONE DETECTED Final   Barbiturates 08/19/2020 NONE DETECTED  NONE DETECTED Final   Comment: (NOTE) DRUG SCREEN FOR MEDICAL PURPOSES ONLY.  IF CONFIRMATION IS NEEDED FOR ANY PURPOSE, NOTIFY LAB WITHIN 5 DAYS.  LOWEST DETECTABLE LIMITS FOR URINE DRUG SCREEN Drug Class                     Cutoff (ng/mL) Amphetamine and metabolites    1000 Barbiturate and metabolites    200 Benzodiazepine                 200 Tricyclics and metabolites     300 Opiates and metabolites        300 Cocaine and metabolites        300 THC                            50 Performed at [facility], [address], [place], [place] [address]    SARS Coronavirus 2 by RT PCR 08/19/2020 NEGATIVE  NEGATIVE Final   Comment: (NOTE) SARS-CoV-2 target nucleic acids are NOT DETECTED.  The SARS-CoV-2 RNA is generally detectable in upper respiratory specimens during the acute phase  of infection. The lowest concentration of SARS-CoV-2 viral copies this assay can detect is 138 copies/mL. A negative result does not preclude SARS-Cov-2 infection and should not be used as the sole basis for treatment or other patient management decisions. A negative result may occur with  improper specimen collection/handling, submission of specimen other than nasopharyngeal swab, presence of viral mutation(s) within the areas targeted by this assay, and inadequate number of viral copies(<138 copies/mL). A negative result must be combined with clinical observations, patient history, and epidemiological information. The expected result is Negative.  Fact Sheet for Patients:  BloggerCourse.com  Fact Sheet for Healthcare Providers:  SeriousBroker.it  This test is no                          t yet approved or cleared by the [place] FDA and  has been authorized for detection and/or diagnosis of SARS-CoV-2 by FDA under an Emergency Use Authorization (EUA). This EUA will remain  in effect (meaning this test can be used) for the duration of the COVID-19 declaration under Section 564(b)(1) of the Act, 21 U.S.C.section 360bbb-3(b)(1), unless the authorization is terminated  or revoked sooner.       Influenza A by PCR 08/19/2020 NEGATIVE  NEGATIVE Final  Influenza B by PCR 08/19/2020 NEGATIVE  NEGATIVE Final   Comment: (NOTE) The Xpert Xpress SARS-CoV-2/FLU/RSV plus assay is intended as an aid in the diagnosis of influenza from Nasopharyngeal swab specimens and should not be used as a sole basis for treatment. Nasal washings and aspirates are unacceptable for Xpert Xpress SARS-CoV-2/FLU/RSV testing.  Fact Sheet for Patients: BloggerCourse.com  Fact Sheet for Healthcare Providers: SeriousBroker.it  This test is not yet approved or cleared by the [place] FDA and has been  authorized for detection and/or diagnosis of SARS-CoV-2 by FDA under an Emergency Use Authorization (EUA). This EUA will remain in effect (meaning this test can be used) for the duration of the COVID-19 declaration under Section 564(b)(1) of the Act, 21 U.S.C. section 360bbb-3(b)(1), unless the authorization is terminated or revoked.  Performed at [facility], [address], [place], [place] [address]     Allergies: Patient has no known allergies.  PTA Medications: (Not in a hospital admission)   Medical Decision Making  Patient is a 50 year old male with psychiatric history significant for substance-induced mood disorder, MDD, polysubstance abuse (including meth and opioid use), who presents to [facility] voluntarily for SI with plan to overdose on fentanyl and requesting assistance with substance abuse treatment.  Although it appears that patient has been not receptive to substance abuse treatment recommendations made by [facility] treatment team in the recent past and although there is suspicion for secondary gain based on patient's history and documentation of patient's recent [facility] visits, patient cannot contract for safety at this time and states that if he is to be discharged, he will try to kill himself.  Thus, based on patient's inability to contract for safety, will admit patient to [facility] continuous assessment at this time with hopes that patient will be receptive to substance abuse treatment recommendations/options upon 11/18/2020 reevaluation.    Recommendations  Based on my evaluation the patient does not appear to have an emergency medical condition. Patient will be placed in [facility] continues assessment for further stabilization and treatment.  Patient will be reevaluated by the treatment team on 11/18/2020 and disposition to be determined at that time.  Homeless shelter resources and substance abuse treatment resources provided to the patient and these resources were reviewed by Duard Brady, LMFT with the patient.  These resources were placed in patient's locker with patient's belongings.   Labs ordered and reviewed:  -PCR COVID: Negative   -UDS: Negative for all substances (was positive for amphetamines on 11/15/2020)  Patient recently had CBC with differential, ethanol level, and CMP done on 11/15/2020.  11/15/2020 CBC with differential within normal limits.  11/15/2020 ethanol level within normal limits.  11/15/2020 CMP showed slight decrease in potassium at 3.4 mmol/L, slightly decreased calcium at 8.8 mg/dL, and slight decrease in total protein 6.4 g/dL.  11/15/2020 CMP was otherwise unremarkable and 11/15/2020 CMP abnormalities did not appear to be emergent.  Based on patient recently having CMP and CBC done on 11/15/2020 and patient not having any alarming symptoms at this time, do not believe that repeat blood work needs to be done on the patient at this time.  Unable to determine if patient received oral potassium supplementation on 11/15/2020.  One-time dose of potassium chloride p.o. 40 mEq ordered for patient's mild hypokalemia noted on 11/15/2020.  Patient had a lipid panel, hemoglobin A1c, and TSH done on 10/11/2020.  These previous lab values were reviewed due to patient's history of taking antipsychotic medications.  10/11/2020 lipid panel shows  elevated triglycerides at 173 mg/dL and reduced HDL at 35 mg/dL.  10/11/2020 lipid panel otherwise unremarkable.  10/11/2020 hemoglobin A1c shows slightly elevated A1c in the prediabetic range at 5.8%.  10/11/2020 TSH within normal limits.  No repeat A1c, lipid panel, or TSH needed at this time.  11/15/2020 emergency department EKG reviewed due to patient's history of taking antipsychotic medications.  11/15/2020 shows QT/QTC of 385/407 ms.  QT/QTC is appropriate for restarting antipsychotic medications in the future if deemed necessary in the future.  Patient denies AVH currently on exam and does not appear to be psychotic on exam.  Although  patient states that he is unable to recall if he has been experiencing AVH recently, based on patient currently denying AVH and patient not appearing to be psychotic on exam, do not believe that reinitiation of antipsychotic medications are necessary at this time.  No antipsychotic medications were started at this time.  Based on patient's presentation and my exam, I do not believe that the patient is experiencing substance withdrawal at this time.  Thus, no withdrawal protocols ordered at this time.  Will restart Remeron 15 mg p.o. daily at bedtime for depression, anxiety Will restart Vistaril 25 mg p.o. 3 times daily as needed for anxiety We will restart nicotine patch 14 mg transdermal daily x24 hours for nicotine cravings/cessation Trazodone 50 mg p.o. at bedtime as needed ordered for sleep.  Jaclyn Shaggy, PA-C 11/18/20  1:33 AM

## 2020-11-18 DIAGNOSIS — F332 Major depressive disorder, recurrent severe without psychotic features: Secondary | ICD-10-CM

## 2020-11-18 DIAGNOSIS — F1110 Opioid abuse, uncomplicated: Secondary | ICD-10-CM

## 2020-11-18 DIAGNOSIS — F1520 Other stimulant dependence, uncomplicated: Secondary | ICD-10-CM | POA: Diagnosis not present

## 2020-11-18 DIAGNOSIS — F1994 Other psychoactive substance use, unspecified with psychoactive substance-induced mood disorder: Secondary | ICD-10-CM | POA: Diagnosis not present

## 2020-11-18 DIAGNOSIS — R45851 Suicidal ideations: Secondary | ICD-10-CM

## 2020-11-18 LAB — POCT URINE DRUG SCREEN - MANUAL ENTRY (I-SCREEN)
POC Amphetamine UR: NOT DETECTED
POC Buprenorphine (BUP): NOT DETECTED
POC Cocaine UR: NOT DETECTED
POC Marijuana UR: NOT DETECTED
POC Methadone UR: NOT DETECTED
POC Methamphetamine UR: NOT DETECTED
POC Morphine: NOT DETECTED
POC Oxazepam (BZO): NOT DETECTED
POC Oxycodone UR: NOT DETECTED
POC Secobarbital (BAR): NOT DETECTED

## 2020-11-18 LAB — POC SARS CORONAVIRUS 2 AG -  ED: SARS Coronavirus 2 Ag: NEGATIVE

## 2020-11-18 LAB — RESP PANEL BY RT-PCR (FLU A&B, COVID) ARPGX2
Influenza A by PCR: NEGATIVE
Influenza B by PCR: NEGATIVE
SARS Coronavirus 2 by RT PCR: NEGATIVE

## 2020-11-18 MED ORDER — POTASSIUM CHLORIDE CRYS ER 20 MEQ PO TBCR
40.0000 meq | EXTENDED_RELEASE_TABLET | Freq: Once | ORAL | Status: AC
  Administered 2020-11-18: 40 meq via ORAL
  Filled 2020-11-18: qty 2

## 2020-11-18 NOTE — Progress Notes (Signed)
CSW contacted shelters regarding bed availability with the following results:  [facility] [facility] [employer]): pt has to call the hotline at [phone].  [facility]: pt has to call them for self.  [facility]: HIPPA compliant voicemail left for follow up.  [facility]: HIPPA compliant voicemail left for follow up.  [facility]: HIPPA compliant text sent, awaiting response.  [facility]: pt has to call first for a screening.   Vilma Meckel. Algis Greenhouse, MSW, LCSW, LCAS 11/18/2020 11:29 AM

## 2020-11-18 NOTE — ED Notes (Signed)
Mr Tristan Valdez remains asleep at this time no distress or c/o pain noted respiration are 14 bpm and easy skin color approprate for ethnicity .

## 2020-11-18 NOTE — ED Notes (Signed)
Patient resting on bed, eyes closed.  Resp even and unlabored.  Continue to monitor for safety.

## 2020-11-18 NOTE — ED Notes (Signed)
Patient appears to be sleeping.  RR even and unlabored.  Continue to monitor for safety. 

## 2020-11-18 NOTE — Progress Notes (Signed)
CSW met with pt briefly to give shelter resources. Pt was given information on [address], [facility], and [facility] as these were the only ones that confirmed their process. Pt was informed that he would have to contact them on his own for screening. No other concerns expressed. Contact ended without incident.    R. , MSW, LCSW, LCAS 11/18/2020 11:52 AM  

## 2020-11-18 NOTE — ED Notes (Signed)
Remains asleep no distress noted. ?

## 2020-11-18 NOTE — ED Provider Notes (Signed)
FBC/OBS ASAP Discharge Summary  Date and Time: 11/18/2020 9:47 PM  Name: Tristan Valdez  MRN:  [id number]   Discharge Diagnoses:  Final diagnoses:  Substance induced mood disorder (HCC)  MDD (major depressive disorder), recurrent severe, without psychosis (HCC)  Amphetamine use disorder, severe, dependence (HCC)  Opioid abuse (HCC)  Suicidal ideation    Subjective:   Tristan Valdez, 50 y.o., male patient presented to [facility] with suicidal ideations and requesting substance abuse treatment on 11/17/2020.Marland Kitchen  Patient seen face to face by this provider, Dr. Bronwen Betters; and  chart reviewed on 11/18/20.     During evaluation Tristan Valdez is in sitting position.  He is alert/oriented x 4; and cooperative.  Reports his mood is depressed due to his circumstances with congruent affect.  Patient is speaking in a clear tone at moderate volume, and normal pace; with good eye contact. There is no indication that he is currently responding to internal/external stimuli or experiencing delusional thought content.  Patient denies self-harm/homicidal ideation, psychosis, and paranoia.  Patient endorses suicidal ideations, without intent, plan, or access to means.  Patient denies access to firearms/weapons.  Patient states he is too old to be living on the streets.  States "I cannot do this anymore I need help so I am not on the streets".  Explained to patient we do not work on housing.  States he has been on the [facility] waitlist for 4 months.  States he does not have any family members that he lives with at this time.  Patient was focused on housing throughout the assessment.   This patient has a history of endorsing suicidal ideation,. Patients' suicidal ideation appears to be conditional and is a means of manipulation and attention seeking without a true intention to harm himself.  His behavior is more consistent with someone who is acting in self-preservation, rather than someone who is acutely suicidal.   Given all that is stated above and including the fact that the patient shows no signs of mania or psychosis and has a liner, coherent thought process throughout the interview today, the patient does not meet criteria for inpatient psychiatric treatment or further psychiatric evaluation at this time.     Stay Summary:   Tristan Valdez was admitted to [facility] Continuous Assessment unit for suicidal ideations and crisis management.  Patient was monitored by continuous assessment/observation and he is eport of symptom reduction.  He is emotional and mental status was also monitored by staff.    Patient was compliant and cooperative on the unit.        Tristan Valdez was evaluated for stability and plans for continued recovery upon discharge.  He was offered further treatment options upon discharge including but not limited to Residential, Intensive Outpatient, Outpatient treatment, Rehabilitation services, and resources for shelters and [facility]way[facility]house if needed.  Patient reports he has lived in multiple facilities including [address], and [facility].  Patient declined [facility],and [facility]. States they are all the same you have to work.  Patient states he does not mind working, but that work is too hard.  Patient states he would be willing to take a shelter bed however, after multiple calls to local shelters no bed was available.    Upon completion of this admission the Tristan Valdez was both mentally and medically stable for discharge denying suicidal/homicidal ideation, auditory/visual/tactile hallucinations, delusional thoughts and paranoia.  Patient was discharged with a bus pass.   Total Time spent with patient: 30 minutes  Past Psychiatric History: Substance induced mood disorder, MDD, polysubstance abuse Past Medical History:  Past Medical History:  Diagnosis Date   Arthritis    Bilateral club feet    Hypertension     Past Surgical History:  Procedure  Laterality Date   APPENDECTOMY     reconstruction of tendons of left arm Left    Family History:  Family History  Problem Relation Age of Onset   CAD Mother    CAD Father    Family Psychiatric History: Unknown Social History:  Social History   Substance and Sexual Activity  Alcohol Use Never     Social History   Substance and Sexual Activity  Drug Use Not Currently   Types: Methamphetamines   Comment: Fentanyl    Social History   Socioeconomic History   Marital status: Divorced    Spouse name: Not on file   Number of children: Not on file   Years of education: Not on file   Highest education level: Not on file  Occupational History   Not on file  Tobacco Use   Smoking status: Every Day    Packs/day: 1.00    Types: Cigarettes   Smokeless tobacco: Never   Tobacco comments:    Last used: 2 days ago  Vaping Use   Vaping Use: Some days  Substance and Sexual Activity   Alcohol use: Never   Drug use: Not Currently    Types: Methamphetamines    Comment: Fentanyl   Sexual activity: Not Currently  Other Topics Concern   Not on file  Social History Narrative   Not on file   Social Determinants of Health   Financial Resource Strain: Not on file  Food Insecurity: Not on file  Transportation Needs: Not on file  Physical Activity: Not on file  Stress: Not on file  Social Connections: Not on file   SDOH:  SDOH Screenings   Alcohol Screen: Low Risk    Last Alcohol Screening Score (AUDIT): 0  Depression (PHQ2-9): Medium Risk   PHQ-2 Score: 19  Financial Resource Strain: Not on file  Food Insecurity: Not on file  Housing: Not on file  Physical Activity: Not on file  Social Connections: Not on file  Stress: Not on file  Tobacco Use: High Risk   Smoking Tobacco Use: Every Day   Smokeless Tobacco Use: Never  Transportation Needs: Not on file    Tobacco Cessation:  A prescription for an FDA-approved tobacco cessation medication was offered at discharge and  the patient refused  Current Medications:  No current facility-administered medications for this encounter.   No current outpatient medications on file.    PTA Medications: (Not in a hospital admission)   Musculoskeletal  Strength & Muscle Tone: within normal limits Gait & Station: normal Patient leans: N/A  Psychiatric Specialty Exam  Presentation  General Appearance: Disheveled  Eye Contact:Good  Speech:Clear and Coherent; Normal Rate  Speech Volume:Normal  Handedness:Right   Mood and Affect  Mood:Anxious  Affect:Congruent   Thought Process  Thought Processes:Coherent  Descriptions of Associations:Intact  Orientation:Full (Time, Place and Person)  Thought Content:Logical  Diagnosis of Schizophrenia or Schizoaffective disorder in past: No  Duration of Psychotic Symptoms: Less than six months   Hallucinations:Hallucinations: None  Ideas of Reference:None  Suicidal Thoughts:Suicidal Thoughts: Yes, Passive SI Active Intent and/or Plan: Without Intent; Without Plan; Without Means to Carry Out  Homicidal Thoughts:Homicidal Thoughts: No   Sensorium  Memory:Immediate Good; Recent Good; Remote Good  Judgment:Fair  Insight:Fair  Executive Functions  Concentration:Good  Attention Span:Good  Recall:Good  Fund of Knowledge:Good  Language:Good   Psychomotor Activity  Psychomotor Activity:Psychomotor Activity: Normal   Assets  Assets:Communication Skills; Desire for Improvement; Physical Health; Resilience; Leisure Time   Sleep  Sleep:Sleep: Fair   Nutritional Assessment (For OBS and FBC admissions only) Has the patient had a weight loss or gain of 10 pounds or more in the last 3 months?: Yes Has the patient had a decrease in food intake/or appetite?: Yes Does the patient have dental problems?: No Does the patient have eating habits or behaviors that may be indicators of an eating disorder including binging or inducing vomiting?: No Has  the patient recently lost weight without trying?: Yes, 2-13 lbs. Has the patient been eating poorly because of a decreased appetite?: Yes Malnutrition Screening Tool Score: 2   Physical Exam  Physical Exam Vitals and nursing note reviewed.  Constitutional:      General: He is not in acute distress.    Appearance: He is well-developed. He is not ill-appearing.  HENT:     Head: Normocephalic.     Right Ear: External ear normal.     Left Ear: External ear normal.  Eyes:     General:        Right eye: No discharge.        Left eye: No discharge.     Conjunctiva/sclera: Conjunctivae normal.  Cardiovascular:     Rate and Rhythm: Normal rate.  Pulmonary:     Effort: Pulmonary effort is normal. No respiratory distress.  Musculoskeletal:        General: Normal range of motion.     Cervical back: Normal range of motion.  Skin:    General: Skin is warm and dry.  Neurological:     Mental Status: He is alert and oriented to person, place, and time.  Psychiatric:        Attention and Perception: Attention and perception normal.        Mood and Affect: Mood is anxious and depressed.        Speech: Speech normal.        Behavior: Behavior is cooperative.        Thought Content: Thought content includes suicidal ideation. Thought content does not include suicidal plan.        Cognition and Memory: Cognition normal.        Judgment: Judgment is impulsive.  lau Review of Systems  Constitutional: Negative.  Negative for chills and fever.  HENT: Negative.  Negative for hearing loss.   Eyes: Negative.   Respiratory: Negative.  Negative for cough.   Cardiovascular: Negative.  Negative for chest pain.  Musculoskeletal: Negative.   Skin: Negative.   Neurological: Negative.   Psychiatric/Behavioral:  Positive for depression and suicidal ideas. The patient is nervous/anxious.   Blood pressure (!) 143/81, pulse 62, temperature 98.6 F (37 C), temperature source Oral, resp. rate 16, SpO2 100 %.  There is no height or weight on file to calculate BMI.  Demographic Factors:  Male, Caucasian, and Low socioeconomic status  Loss Factors: Decrease in vocational status and Financial problems/change in socioeconomic status  Historical Factors: Impulsivity  Risk Reduction Factors:   Sense of responsibility to family and Positive social support  Continued Clinical Symptoms:  Depression:   Comorbid alcohol abuse/dependence Impulsivity Alcohol/Substance Abuse/Dependencies  Cognitive Features That Contribute To Risk:  None    Suicide Risk:  Minimal: No identifiable suicidal ideation.  Patients presenting with no risk  factors but with morbid ruminations; may be classified as minimal risk based on the severity of the depressive symptoms  Plan Of Care/Follow-up recommendations:  Activity:  as tolerated  Diet:  regular  Disposition:   Discharge patient.  Patient does not meet criteria for inpatient psychiatric admission  Provided outpatient psychiatric resources, shelter resources, outpatient/inpatient substance abuse treatment.  No evidence of imminent risk to self or others at present.    Patient does not meet criteria for psychiatric inpatient admission. Discussed crisis plan, support from social network, calling 911, coming to the Emergency Department, and calling Suicide Hotline.   Ardis Hughs, NP 11/18/2020, 9:47 PM
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
## 2020-11-18 NOTE — ED Notes (Signed)
AVS reviewed with patient and all questions answered.  Patient verbalized understanding of information presented.  Patient ambulated independently to lobby.  Discharged in stable condition; no acute distress noted.

## 2020-11-18 NOTE — ED Triage Notes (Signed)
Mr Tristan Valdez has verbalized suicidal intent with 8 visits to to healthcare facilities since April 2022. Affect remains flat but mood is WNL, behavior self controlled, and currently admits to another episode of suicidal ideation with a plan to over dose on street fentanyl. Mr Tristan Valdez further stated " You can find fentanyl every where in the streets".

## 2020-11-18 NOTE — Discharge Instructions (Addendum)
[employer]  [phone] [address] [place], [place] [address]  p [phone]    Patient is instructed prior to discharge to: Take all medications as prescribed by his/her mental healthcare provider. Report any adverse effects and or reactions from the medicines to his/her outpatient provider promptly. Patient has been instructed & cautioned: To not engage in alcohol and or illegal drug use while on prescription medicines. In the event of worsening symptoms, patient is instructed to call the crisis hotline, 911 and or go to the nearest ED for appropriate evaluation and treatment of symptoms. To follow-up with his/her primary care provider for your other medical issues, concerns and or health care needs.
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
## 2023-11-10 ENCOUNTER — Emergency Department (HOSPITAL_COMMUNITY)
Admission: EM | Admit: 2023-11-10 | Discharge: 2023-11-10 | Disposition: A | Discharge status: Home / Self Care | Payer: MEDICAID | Attending: Emergency Medicine | Admitting: Emergency Medicine

## 2023-11-10 ENCOUNTER — Encounter (HOSPITAL_COMMUNITY): Payer: Self-pay

## 2023-11-10 ENCOUNTER — Other Ambulatory Visit: Payer: Self-pay

## 2023-11-10 DIAGNOSIS — E860 Dehydration: Secondary | ICD-10-CM | POA: Insufficient documentation

## 2023-11-10 DIAGNOSIS — F1510 Other stimulant abuse, uncomplicated: Secondary | ICD-10-CM | POA: Insufficient documentation

## 2023-11-10 LAB — COMPREHENSIVE METABOLIC PANEL WITH GFR
ALT: 24 U/L (ref 0–44)
AST: 24 U/L (ref 15–41)
Albumin: 4.9 g/dL (ref 3.5–5.0)
Alkaline Phosphatase: 60 U/L (ref 38–126)
Anion gap: 12 (ref 5–15)
BUN: 33 mg/dL — ABNORMAL HIGH (ref 6–20)
CO2: 26 mmol/L (ref 22–32)
Calcium: 9.8 mg/dL (ref 8.9–10.3)
Chloride: 102 mmol/L (ref 98–111)
Creatinine, Ser: 1.43 mg/dL — ABNORMAL HIGH (ref 0.61–1.24)
GFR, Estimated: 59 mL/min — ABNORMAL LOW (ref >60)
Glucose, Bld: 112 mg/dL — ABNORMAL HIGH (ref 70–99)
Potassium: 3.7 mmol/L (ref 3.5–5.1)
Sodium: 140 mmol/L (ref 135–145)
Total Bilirubin: 1.6 mg/dL — ABNORMAL HIGH (ref 0.0–1.2)
Total Protein: 8.6 g/dL — ABNORMAL HIGH (ref 6.5–8.1)

## 2023-11-10 LAB — CBC WITH DIFFERENTIAL/PLATELET
Abs Immature Granulocytes: 0.06 K/uL (ref 0.00–0.07)
Basophils Absolute: 0 K/uL (ref 0.0–0.1)
Basophils Relative: 0 %
Eosinophils Absolute: 0.1 K/uL (ref 0.0–0.5)
Eosinophils Relative: 1 %
HCT: 49.2 % (ref 39.0–52.0)
Hemoglobin: 15.8 g/dL (ref 13.0–17.0)
Immature Granulocytes: 1 %
Lymphocytes Relative: 14 %
Lymphs Abs: 1.4 K/uL (ref 0.7–4.0)
MCH: 29.5 pg (ref 26.0–34.0)
MCHC: 32.1 g/dL (ref 30.0–36.0)
MCV: 92 fL (ref 80.0–100.0)
Monocytes Absolute: 0.9 K/uL (ref 0.1–1.0)
Monocytes Relative: 8 %
Neutro Abs: 7.8 K/uL — ABNORMAL HIGH (ref 1.7–7.7)
Neutrophils Relative %: 76 %
Platelets: 271 K/uL (ref 150–400)
RBC: 5.35 MIL/uL (ref 4.22–5.81)
RDW: 13 % (ref 11.5–15.5)
WBC: 10.2 K/uL (ref 4.0–10.5)
nRBC: 0 % (ref 0.0–0.2)

## 2023-11-10 LAB — ETHANOL: Alcohol, Ethyl (B): <15 mg/dL (ref <15)

## 2023-11-10 LAB — RAPID URINE DRUG SCREEN, HOSP PERFORMED
Amphetamines: POSITIVE — AB
Barbiturates: NOT DETECTED
Benzodiazepines: NOT DETECTED
Cocaine: NOT DETECTED
Opiates: NOT DETECTED
Tetrahydrocannabinol: NOT DETECTED

## 2023-11-10 MED ORDER — SODIUM CHLORIDE 0.9 % IV BOLUS
1000.0000 mL | Freq: Once | INTRAVENOUS | Status: AC
  Administered 2023-11-10: 1000 mL via INTRAVENOUS

## 2023-11-10 NOTE — ED Triage Notes (Signed)
 [place].  Bystander called. Pt does not remember how he got to [place]. Stated he thought he was in front of [facility]. Also stated has not ate or drink in 5 days. VSS.   CBG 100

## 2023-11-10 NOTE — ED Provider Notes (Signed)
 Tristan Valdez EMERGENCY DEPARTMENT AT [facility] Provider Note   CSN: [id number] Arrival date & time: 11/10/23  8144     Patient presents with: Dehydration   Tristan Valdez is a 53 y.o. male.   Patient here with concern for dehydration.  Tristan Valdez is relapsed on methamphetamines.  Denies any alcohol or other drug use otherwise.  Tristan Valdez typically gets a port in the [facility] area.  Tristan Valdez has not had much to eat or drink.  Tristan Valdez denies any chest pain abdominal pain nausea vomiting diarrhea.  Denies any homicidal suicidal ideation.  Tristan Valdez is asking for some hydration and may be some social work help.  The history is provided by the patient.       Prior to Admission medications   Medication Sig Start Date End Date Taking? Authorizing Provider  mirtazapine  (REMERON ) 15 MG tablet Take 1 tablet (15 mg total) by mouth at bedtime. Patient not taking: Reported on 11/18/2020 11/15/20 11/18/20  Teresa Wyline CROME, NP    Allergies: Patient has no known allergies.    Review of Systems  Updated Vital Signs BP (!) 149/89   Pulse 68   Temp 98.9 F (37.2 C) (Oral)   Resp 18   Ht 6' (1.829 m)   Wt 70 kg   SpO2 100%   BMI 20.93 kg/m   Physical Exam Vitals and nursing note reviewed.  Constitutional:      General: Tristan Valdez is not in acute distress.    Appearance: Tristan Valdez is well-developed. Tristan Valdez is not ill-appearing.  HENT:     Head: Normocephalic and atraumatic.     Nose: Nose normal.     Mouth/Throat:     Mouth: Mucous membranes are moist.  Eyes:     Extraocular Movements: Extraocular movements intact.     Conjunctiva/sclera: Conjunctivae normal.     Pupils: Pupils are equal, round, and reactive to light.  Cardiovascular:     Rate and Rhythm: Normal rate and regular rhythm.     Pulses: Normal pulses.     Heart sounds: Normal heart sounds. No murmur heard. Pulmonary:     Effort: Pulmonary effort is normal. No respiratory distress.     Breath sounds: Normal breath sounds.  Abdominal:     General:  Abdomen is flat.     Palpations: Abdomen is soft.     Tenderness: There is no abdominal tenderness.  Musculoskeletal:        General: No swelling.     Cervical back: Normal range of motion and neck supple.  Skin:    General: Skin is warm and dry.     Capillary Refill: Capillary refill takes less than 2 seconds.  Neurological:     General: No focal deficit present.     Mental Status: Tristan Valdez is alert and oriented to person, place, and time.     Cranial Nerves: No cranial nerve deficit.     Sensory: No sensory deficit.     Motor: No weakness.     Coordination: Coordination normal.  Psychiatric:        Mood and Affect: Mood normal.     (all labs ordered are listed, but only abnormal results are displayed) Labs Reviewed  COMPREHENSIVE METABOLIC PANEL WITH GFR - Abnormal; Notable for the following components:      Result Value   Glucose, Bld 112 (*)    BUN 33 (*)    Creatinine, Ser 1.43 (*)    Total Protein 8.6 (*)    Total Bilirubin 1.6 (*)  GFR, Estimated 59 (*)    All other components within normal limits  RAPID URINE DRUG SCREEN, HOSP PERFORMED - Abnormal; Notable for the following components:   Amphetamines POSITIVE (*)    All other components within normal limits  CBC WITH DIFFERENTIAL/PLATELET - Abnormal; Notable for the following components:   Neutro Abs 7.8 (*)    All other components within normal limits  ETHANOL    EKG: None  Radiology: No results found.   Procedures   Medications Ordered in the ED  sodium chloride  0.9 % bolus 1,000 mL (1,000 mLs Intravenous New Bag/Given 11/10/23 1951)                                    Medical Decision Making Amount and/or Complexity of Data Reviewed Labs: ordered.   Tristan Valdez is here with concern for dehydration, meth use.  Tristan Valdez is asking for some social help.  Tristan Valdez denies any suicidal homicidal ideation.  I will see if his social worker is available to talk with him.  Tristan Valdez mostly has had support with his drug issues  in the [facility] area.  Tristan Valdez denies any abdominal pain chest pain nausea vomiting diarrhea.  Tristan Valdez like something to eat and drink.  Will check basic labs give IV fluids and reevaluate.  Lab work is unremarkable.  Was able to eat and drink without any issues.  Unfortunately we could not help him with any resources to get back to [place] or to sober living facility in [place] at this time.  Tristan Valdez was given local resources and discharge.  This chart was dictated using voice recognition software.  Despite best efforts to proofread,  errors can occur which can change the documentation meaning.      Final diagnoses:  Dehydration  Methamphetamine abuse [facility])    ED Discharge Orders     None          Ruthe Cornet, DO 11/10/23 2122

## 2023-11-10 NOTE — ED Notes (Signed)
 Pt has two bag of belongings at nurses station 9-25

## 2023-11-10 NOTE — ED Notes (Signed)
Pt dressed in burgundy scrubs
